# Patient Record
Sex: Female | Born: 1975 | Race: White | Hispanic: No | Marital: Married | Smoking: Former smoker
Health system: Southern US, Community
[De-identification: ages and names within clinical notes are randomized; demographics above are authoritative.]

## PROBLEM LIST (undated history)

## (undated) DIAGNOSIS — G43909 Migraine, unspecified, not intractable, without status migrainosus: Secondary | ICD-10-CM

## (undated) DIAGNOSIS — T7840XA Allergy, unspecified, initial encounter: Secondary | ICD-10-CM

## (undated) DIAGNOSIS — J45909 Unspecified asthma, uncomplicated: Secondary | ICD-10-CM

## (undated) HISTORY — DX: Migraine, unspecified, not intractable, without status migrainosus: G43.909

## (undated) HISTORY — PX: WISDOM TOOTH EXTRACTION: SHX21

## (undated) HISTORY — DX: Allergy, unspecified, initial encounter: T78.40XA

## (undated) HISTORY — DX: Unspecified asthma, uncomplicated: J45.909

---

## 2015-06-06 ENCOUNTER — Encounter: Payer: Self-pay | Admitting: Osteopathic Medicine

## 2015-06-06 ENCOUNTER — Ambulatory Visit (INDEPENDENT_AMBULATORY_CARE_PROVIDER_SITE_OTHER): Payer: PRIVATE HEALTH INSURANCE | Admitting: Osteopathic Medicine

## 2015-06-06 ENCOUNTER — Ambulatory Visit (INDEPENDENT_AMBULATORY_CARE_PROVIDER_SITE_OTHER): Payer: PRIVATE HEALTH INSURANCE

## 2015-06-06 VITALS — BP 129/77 | HR 73 | Ht 65.0 in | Wt 204.0 lb

## 2015-06-06 DIAGNOSIS — Z975 Presence of (intrauterine) contraceptive device: Secondary | ICD-10-CM | POA: Diagnosis not present

## 2015-06-06 DIAGNOSIS — J4530 Mild persistent asthma, uncomplicated: Secondary | ICD-10-CM | POA: Diagnosis not present

## 2015-06-06 DIAGNOSIS — Z8669 Personal history of other diseases of the nervous system and sense organs: Secondary | ICD-10-CM | POA: Insufficient documentation

## 2015-06-06 DIAGNOSIS — Z13220 Encounter for screening for lipoid disorders: Secondary | ICD-10-CM | POA: Insufficient documentation

## 2015-06-06 DIAGNOSIS — N926 Irregular menstruation, unspecified: Secondary | ICD-10-CM | POA: Insufficient documentation

## 2015-06-06 DIAGNOSIS — J302 Other seasonal allergic rhinitis: Secondary | ICD-10-CM | POA: Diagnosis not present

## 2015-06-06 DIAGNOSIS — J45909 Unspecified asthma, uncomplicated: Secondary | ICD-10-CM | POA: Insufficient documentation

## 2015-06-06 LAB — CBC WITH DIFFERENTIAL/PLATELET
BASOS PCT: 1 % (ref 0–1)
Basophils Absolute: 0.1 10*3/uL (ref 0.0–0.1)
EOS ABS: 0.3 10*3/uL (ref 0.0–0.7)
Eosinophils Relative: 4 % (ref 0–5)
HCT: 42.9 % (ref 36.0–46.0)
Hemoglobin: 14.3 g/dL (ref 12.0–15.0)
LYMPHS ABS: 2.7 10*3/uL (ref 0.7–4.0)
Lymphocytes Relative: 34 % (ref 12–46)
MCH: 28.9 pg (ref 26.0–34.0)
MCHC: 33.3 g/dL (ref 30.0–36.0)
MCV: 86.8 fL (ref 78.0–100.0)
MONO ABS: 0.5 10*3/uL (ref 0.1–1.0)
MONOS PCT: 7 % (ref 3–12)
MPV: 10.3 fL (ref 8.6–12.4)
Neutro Abs: 4.2 10*3/uL (ref 1.7–7.7)
Neutrophils Relative %: 54 % (ref 43–77)
PLATELETS: 304 10*3/uL (ref 150–400)
RBC: 4.94 MIL/uL (ref 3.87–5.11)
RDW: 13 % (ref 11.5–15.5)
WBC: 7.8 10*3/uL (ref 4.0–10.5)

## 2015-06-06 LAB — COMPLETE METABOLIC PANEL WITH GFR
ALBUMIN: 4.3 g/dL (ref 3.6–5.1)
ALK PHOS: 85 U/L (ref 33–115)
ALT: 19 U/L (ref 6–29)
AST: 19 U/L (ref 10–30)
BUN: 11 mg/dL (ref 7–25)
CHLORIDE: 103 mmol/L (ref 98–110)
CO2: 27 mmol/L (ref 20–31)
Calcium: 9.7 mg/dL (ref 8.6–10.2)
Creat: 0.8 mg/dL (ref 0.50–1.10)
GFR, Est African American: >89 mL/min (ref >=60)
GFR, Est Non African American: >89 mL/min (ref >=60)
GLUCOSE: 95 mg/dL (ref 65–99)
POTASSIUM: 4.4 mmol/L (ref 3.5–5.3)
SODIUM: 139 mmol/L (ref 135–146)
Total Bilirubin: 0.4 mg/dL (ref 0.2–1.2)
Total Protein: 7 g/dL (ref 6.1–8.1)

## 2015-06-06 LAB — LIPID PANEL
CHOL/HDL RATIO: 5 ratio (ref <=5.0)
Cholesterol: 165 mg/dL (ref 125–200)
HDL: 33 mg/dL — ABNORMAL LOW (ref >=46)
LDL Cholesterol: 95 mg/dL (ref <130)
Triglycerides: 183 mg/dL — ABNORMAL HIGH (ref <150)
VLDL: 37 mg/dL — ABNORMAL HIGH (ref <30)

## 2015-06-06 LAB — TSH: TSH: 1.345 u[IU]/mL (ref 0.350–4.500)

## 2015-06-06 MED ORDER — MONTELUKAST SODIUM 10 MG PO TABS: 10.0000 mg | ORAL_TABLET | Freq: Every day | ORAL | Status: DC

## 2015-06-06 NOTE — Patient Instructions (Signed)
Preventive Care for Adults A healthy lifestyle and preventive care can promote health and wellness. Preventive health guidelines for women include the following key practices.  A routine yearly physical is a good way to check with your health care provider about your health and preventive screening. It is a chance to share any concerns and updates on your health and to receive a thorough exam.  Visit your dentist for a routine exam and preventive care every 6 months. Brush your teeth twice a day and floss once a day. Good oral hygiene prevents tooth decay and gum disease.  The frequency of eye exams is based on your age, health, family medical history, use of contact lenses, and other factors. Follow your health care provider's recommendations for frequency of eye exams.  Eat a healthy diet. Foods like vegetables, fruits, whole grains, low-fat dairy products, and lean protein foods contain the nutrients you need without too many calories. Decrease your intake of foods high in solid fats, added sugars, and salt. Eat the right amount of calories for you.Get information about a proper diet from your health care provider, if necessary.  Regular physical exercise is one of the most important things you can do for your health. Most adults should get at least 150 minutes of moderate-intensity exercise (any activity that increases your heart rate and causes you to sweat) each week. In addition, most adults need muscle-strengthening exercises on 2 or more days a week.  Maintain a healthy weight. The body mass index (BMI) is a screening tool to identify possible weight problems. It provides an estimate of body fat based on height and weight. Your health care provider can find your BMI and can help you achieve or maintain a healthy weight.For adults 20 years and older:  A BMI below 18.5 is considered underweight.  A BMI of 18.5 to 24.9 is normal.  A BMI of 25 to 29.9 is considered overweight.  A BMI of  30 and above is considered obese.  Maintain normal blood lipids and cholesterol levels by exercising and minimizing your intake of saturated fat. Eat a balanced diet with plenty of fruit and vegetables. Blood tests for lipids and cholesterol should begin at age 76 and be repeated every 5 years. If your lipid or cholesterol levels are high, you are over 50, or you are at high risk for heart disease, you may need your cholesterol levels checked more frequently.Ongoing high lipid and cholesterol levels should be treated with medicines if diet and exercise are not working.  If you smoke, find out from your health care provider how to quit. If you do not use tobacco, do not start.  Lung cancer screening is recommended for adults aged 22-80 years who are at high risk for developing lung cancer because of a history of smoking. A yearly low-dose CT scan of the lungs is recommended for people who have at least a 30-pack-year history of smoking and are a current smoker or have quit within the past 15 years. A pack year of smoking is smoking an average of 1 pack of cigarettes a day for 1 year (for example: 1 pack a day for 30 years or 2 packs a day for 15 years). Yearly screening should continue until the smoker has stopped smoking for at least 15 years. Yearly screening should be stopped for people who develop a health problem that would prevent them from having lung cancer treatment.  If you are pregnant, do not drink alcohol. If you are breastfeeding,  be very cautious about drinking alcohol. If you are not pregnant and choose to drink alcohol, do not have more than 1 drink per day. One drink is considered to be 12 ounces (355 mL) of beer, 5 ounces (148 mL) of wine, or 1.5 ounces (44 mL) of liquor.  Avoid use of street drugs. Do not share needles with anyone. Ask for help if you need support or instructions about stopping the use of drugs.  High blood pressure causes heart disease and increases the risk of  stroke. Your blood pressure should be checked at least every 1 to 2 years. Ongoing high blood pressure should be treated with medicines if weight loss and exercise do not work.  If you are 3-86 years old, ask your health care provider if you should take aspirin to prevent strokes.  Diabetes screening involves taking a blood sample to check your fasting blood sugar level. This should be done once every 3 years, after age 67, if you are within normal weight and without risk factors for diabetes. Testing should be considered at a younger age or be carried out more frequently if you are overweight and have at least 1 risk factor for diabetes.  Breast cancer screening is essential preventive care for women. You should practice "breast self-awareness." This means understanding the normal appearance and feel of your breasts and may include breast self-examination. Any changes detected, no matter how small, should be reported to a health care provider. Women in their 8s and 30s should have a clinical breast exam (CBE) by a health care provider as part of a regular health exam every 1 to 3 years. After age 70, women should have a CBE every year. Starting at age 25, women should consider having a mammogram (breast X-ray test) every year. Women who have a family history of breast cancer should talk to their health care provider about genetic screening. Women at a high risk of breast cancer should talk to their health care providers about having an MRI and a mammogram every year.  Breast cancer gene (BRCA)-related cancer risk assessment is recommended for women who have family members with BRCA-related cancers. BRCA-related cancers include breast, ovarian, tubal, and peritoneal cancers. Having family members with these cancers may be associated with an increased risk for harmful changes (mutations) in the breast cancer genes BRCA1 and BRCA2. Results of the assessment will determine the need for genetic counseling and  BRCA1 and BRCA2 testing.  Routine pelvic exams to screen for cancer are no longer recommended for nonpregnant women who are considered low risk for cancer of the pelvic organs (ovaries, uterus, and vagina) and who do not have symptoms. Ask your health care provider if a screening pelvic exam is right for you.  If you have had past treatment for cervical cancer or a condition that could lead to cancer, you need Pap tests and screening for cancer for at least 20 years after your treatment. If Pap tests have been discontinued, your risk factors (such as having a new sexual partner) need to be reassessed to determine if screening should be resumed. Some women have medical problems that increase the chance of getting cervical cancer. In these cases, your health care provider may recommend more frequent screening and Pap tests.  The HPV test is an additional test that may be used for cervical cancer screening. The HPV test looks for the virus that can cause the cell changes on the cervix. The cells collected during the Pap test can be  tested for HPV. The HPV test could be used to screen women aged 30 years and older, and should be used in women of any age who have unclear Pap test results. After the age of 30, women should have HPV testing at the same frequency as a Pap test.  Colorectal cancer can be detected and often prevented. Most routine colorectal cancer screening begins at the age of 50 years and continues through age 75 years. However, your health care provider may recommend screening at an earlier age if you have risk factors for colon cancer. On a yearly basis, your health care provider may provide home test kits to check for hidden blood in the stool. Use of a small camera at the end of a tube, to directly examine the colon (sigmoidoscopy or colonoscopy), can detect the earliest forms of colorectal cancer. Talk to your health care provider about this at age 50, when routine screening begins. Direct  exam of the colon should be repeated every 5-10 years through age 75 years, unless early forms of pre-cancerous polyps or small growths are found.  People who are at an increased risk for hepatitis B should be screened for this virus. You are considered at high risk for hepatitis B if:  You were born in a country where hepatitis B occurs often. Talk with your health care provider about which countries are considered high risk.  Your parents were born in a high-risk country and you have not received a shot to protect against hepatitis B (hepatitis B vaccine).  You have HIV or AIDS.  You use needles to inject street drugs.  You live with, or have sex with, someone who has hepatitis B.  You get hemodialysis treatment.  You take certain medicines for conditions like cancer, organ transplantation, and autoimmune conditions.  Hepatitis C blood testing is recommended for all people born from 1945 through 1965 and any individual with known risks for hepatitis C.  Practice safe sex. Use condoms and avoid high-risk sexual practices to reduce the spread of sexually transmitted infections (STIs). STIs include gonorrhea, chlamydia, syphilis, trichomonas, herpes, HPV, and human immunodeficiency virus (HIV). Herpes, HIV, and HPV are viral illnesses that have no cure. They can result in disability, cancer, and death.  You should be screened for sexually transmitted illnesses (STIs) including gonorrhea and chlamydia if:  You are sexually active and are younger than 24 years.  You are older than 24 years and your health care provider tells you that you are at risk for this type of infection.  Your sexual activity has changed since you were last screened and you are at an increased risk for chlamydia or gonorrhea. Ask your health care provider if you are at risk.  If you are at risk of being infected with HIV, it is recommended that you take a prescription medicine daily to prevent HIV infection. This is  called preexposure prophylaxis (PrEP). You are considered at risk if:  You are a heterosexual woman, are sexually active, and are at increased risk for HIV infection.  You take drugs by injection.  You are sexually active with a partner who has HIV.  Talk with your health care provider about whether you are at high risk of being infected with HIV. If you choose to begin PrEP, you should first be tested for HIV. You should then be tested every 3 months for as long as you are taking PrEP.  Osteoporosis is a disease in which the bones lose minerals and strength   with aging. This can result in serious bone fractures or breaks. The risk of osteoporosis can be identified using a bone density scan. Women ages 65 years and over and women at risk for fractures or osteoporosis should discuss screening with their health care providers. Ask your health care provider whether you should take a calcium supplement or vitamin D to reduce the rate of osteoporosis.  Menopause can be associated with physical symptoms and risks. Hormone replacement therapy is available to decrease symptoms and risks. You should talk to your health care provider about whether hormone replacement therapy is right for you.  Use sunscreen. Apply sunscreen liberally and repeatedly throughout the day. You should seek shade when your shadow is shorter than you. Protect yourself by wearing long sleeves, pants, a wide-brimmed hat, and sunglasses year round, whenever you are outdoors.  Once a month, do a whole body skin exam, using a mirror to look at the skin on your back. Tell your health care provider of new moles, moles that have irregular borders, moles that are larger than a pencil eraser, or moles that have changed in shape or color.  Stay current with required vaccines (immunizations).  Influenza vaccine. All adults should be immunized every year.  Tetanus, diphtheria, and acellular pertussis (Td, Tdap) vaccine. Pregnant women should  receive 1 dose of Tdap vaccine during each pregnancy. The dose should be obtained regardless of the length of time since the last dose. Immunization is preferred during the 27th-36th week of gestation. An adult who has not previously received Tdap or who does not know her vaccine status should receive 1 dose of Tdap. This initial dose should be followed by tetanus and diphtheria toxoids (Td) booster doses every 10 years. Adults with an unknown or incomplete history of completing a 3-dose immunization series with Td-containing vaccines should begin or complete a primary immunization series including a Tdap dose. Adults should receive a Td booster every 10 years.  Varicella vaccine. An adult without evidence of immunity to varicella should receive 2 doses or a second dose if she has previously received 1 dose. Pregnant females who do not have evidence of immunity should receive the first dose after pregnancy. This first dose should be obtained before leaving the health care facility. The second dose should be obtained 4-8 weeks after the first dose.  Human papillomavirus (HPV) vaccine. Females aged 13-26 years who have not received the vaccine previously should obtain the 3-dose series. The vaccine is not recommended for use in pregnant females. However, pregnancy testing is not needed before receiving a dose. If a female is found to be pregnant after receiving a dose, no treatment is needed. In that case, the remaining doses should be delayed until after the pregnancy. Immunization is recommended for any person with an immunocompromised condition through the age of 26 years if she did not get any or all doses earlier. During the 3-dose series, the second dose should be obtained 4-8 weeks after the first dose. The third dose should be obtained 24 weeks after the first dose and 16 weeks after the second dose.  Zoster vaccine. One dose is recommended for adults aged 60 years or older unless certain conditions are  present.  Measles, mumps, and rubella (MMR) vaccine. Adults born before 1957 generally are considered immune to measles and mumps. Adults born in 1957 or later should have 1 or more doses of MMR vaccine unless there is a contraindication to the vaccine or there is laboratory evidence of immunity to   each of the three diseases. A routine second dose of MMR vaccine should be obtained at least 28 days after the first dose for students attending postsecondary schools, health care workers, or international travelers. People who received inactivated measles vaccine or an unknown type of measles vaccine during 1963-1967 should receive 2 doses of MMR vaccine. People who received inactivated mumps vaccine or an unknown type of mumps vaccine before 1979 and are at high risk for mumps infection should consider immunization with 2 doses of MMR vaccine. For females of childbearing age, rubella immunity should be determined. If there is no evidence of immunity, females who are not pregnant should be vaccinated. If there is no evidence of immunity, females who are pregnant should delay immunization until after pregnancy. Unvaccinated health care workers born before 1957 who lack laboratory evidence of measles, mumps, or rubella immunity or laboratory confirmation of disease should consider measles and mumps immunization with 2 doses of MMR vaccine or rubella immunization with 1 dose of MMR vaccine.  Pneumococcal 13-valent conjugate (PCV13) vaccine. When indicated, a person who is uncertain of her immunization history and has no record of immunization should receive the PCV13 vaccine. An adult aged 19 years or older who has certain medical conditions and has not been previously immunized should receive 1 dose of PCV13 vaccine. This PCV13 should be followed with a dose of pneumococcal polysaccharide (PPSV23) vaccine. The PPSV23 vaccine dose should be obtained at least 8 weeks after the dose of PCV13 vaccine. An adult aged 19  years or older who has certain medical conditions and previously received 1 or more doses of PPSV23 vaccine should receive 1 dose of PCV13. The PCV13 vaccine dose should be obtained 1 or more years after the last PPSV23 vaccine dose.  Pneumococcal polysaccharide (PPSV23) vaccine. When PCV13 is also indicated, PCV13 should be obtained first. All adults aged 65 years and older should be immunized. An adult younger than age 65 years who has certain medical conditions should be immunized. Any person who resides in a nursing home or long-term care facility should be immunized. An adult smoker should be immunized. People with an immunocompromised condition and certain other conditions should receive both PCV13 and PPSV23 vaccines. People with human immunodeficiency virus (HIV) infection should be immunized as soon as possible after diagnosis. Immunization during chemotherapy or radiation therapy should be avoided. Routine use of PPSV23 vaccine is not recommended for American Indians, [place] Natives, or people younger than 65 years unless there are medical conditions that require PPSV23 vaccine. When indicated, people who have unknown immunization and have no record of immunization should receive PPSV23 vaccine. One-time revaccination 5 years after the first dose of PPSV23 is recommended for people aged 19-64 years who have chronic kidney failure, nephrotic syndrome, asplenia, or immunocompromised conditions. People who received 1-2 doses of PPSV23 before age 65 years should receive another dose of PPSV23 vaccine at age 65 years or later if at least 5 years have passed since the previous dose. Doses of PPSV23 are not needed for people immunized with PPSV23 at or after age 65 years.  Meningococcal vaccine. Adults with asplenia or persistent complement component deficiencies should receive 2 doses of quadrivalent meningococcal conjugate (MenACWY-D) vaccine. The doses should be obtained at least 2 months apart.  Microbiologists working with certain meningococcal bacteria, military recruits, people at risk during an outbreak, and people who travel to or live in countries with a high rate of meningitis should be immunized. A first-year college student up through age   21 years who is living in a residence hall should receive a dose if she did not receive a dose on or after her 16th birthday. Adults who have certain high-risk conditions should receive one or more doses of vaccine.  Hepatitis A vaccine. Adults who wish to be protected from this disease, have certain high-risk conditions, work with hepatitis A-infected animals, work in hepatitis A research labs, or travel to or work in countries with a high rate of hepatitis A should be immunized. Adults who were previously unvaccinated and who anticipate close contact with an international adoptee during the first 60 days after arrival in the [place] from a country with a high rate of hepatitis A should be immunized.  Hepatitis B vaccine. Adults who wish to be protected from this disease, have certain high-risk conditions, may be exposed to blood or other infectious body fluids, are household contacts or sex partners of hepatitis B positive people, are clients or workers in certain care facilities, or travel to or work in countries with a high rate of hepatitis B should be immunized.  Haemophilus influenzae type b (Hib) vaccine. A previously unvaccinated person with asplenia or sickle cell disease or having a scheduled splenectomy should receive 1 dose of Hib vaccine. Regardless of previous immunization, a recipient of a hematopoietic stem cell transplant should receive a 3-dose series 6-12 months after her successful transplant. Hib vaccine is not recommended for adults with HIV infection. Preventive Services / Frequency Ages 64 to 68 years  Blood pressure check.** / Every 1 to 2 years.  Lipid and cholesterol check.** / Every 5 years beginning at age  22.  Clinical breast exam.** / Every 3 years for women in their 88s and 53s.  BRCA-related cancer risk assessment.** / For women who have family members with a BRCA-related cancer (breast, ovarian, tubal, or peritoneal cancers).  Pap test.** / Every 2 years from ages 90 through 51. Every 3 years starting at age 21 through age 56 or 3 with a history of 3 consecutive normal Pap tests.  HPV screening.** / Every 3 years from ages 24 through ages 1 to 46 with a history of 3 consecutive normal Pap tests.  Hepatitis C blood test.** / For any individual with known risks for hepatitis C.  Skin self-exam. / Monthly.  Influenza vaccine. / Every year.  Tetanus, diphtheria, and acellular pertussis (Tdap, Td) vaccine.** / Consult your health care provider. Pregnant women should receive 1 dose of Tdap vaccine during each pregnancy. 1 dose of Td every 10 years.  Varicella vaccine.** / Consult your health care provider. Pregnant females who do not have evidence of immunity should receive the first dose after pregnancy.  HPV vaccine. / 3 doses over 6 months, if 72 and younger. The vaccine is not recommended for use in pregnant females. However, pregnancy testing is not needed before receiving a dose.  Measles, mumps, rubella (MMR) vaccine.** / You need at least 1 dose of MMR if you were born in 1957 or later. You may also need a 2nd dose. For females of childbearing age, rubella immunity should be determined. If there is no evidence of immunity, females who are not pregnant should be vaccinated. If there is no evidence of immunity, females who are pregnant should delay immunization until after pregnancy.  Pneumococcal 13-valent conjugate (PCV13) vaccine.** / Consult your health care provider.  Pneumococcal polysaccharide (PPSV23) vaccine.** / 1 to 2 doses if you smoke cigarettes or if you have certain conditions.  Meningococcal vaccine.** /  1 dose if you are age 19 to 21 years and a first-year college  student living in a residence hall, or have one of several medical conditions, you need to get vaccinated against meningococcal disease. You may also need additional booster doses.  Hepatitis A vaccine.** / Consult your health care provider.  Hepatitis B vaccine.** / Consult your health care provider.  Haemophilus influenzae type b (Hib) vaccine.** / Consult your health care provider. Ages 40 to 64 years  Blood pressure check.** / Every 1 to 2 years.  Lipid and cholesterol check.** / Every 5 years beginning at age 20 years.  Lung cancer screening. / Every year if you are aged 55-80 years and have a 30-pack-year history of smoking and currently smoke or have quit within the past 15 years. Yearly screening is stopped once you have quit smoking for at least 15 years or develop a health problem that would prevent you from having lung cancer treatment.  Clinical breast exam.** / Every year after age 40 years.  BRCA-related cancer risk assessment.** / For women who have family members with a BRCA-related cancer (breast, ovarian, tubal, or peritoneal cancers).  Mammogram.** / Every year beginning at age 40 years and continuing for as long as you are in good health. Consult with your health care provider.  Pap test.** / Every 3 years starting at age 30 years through age 65 or 70 years with a history of 3 consecutive normal Pap tests.  HPV screening.** / Every 3 years from ages 30 years through ages 65 to 70 years with a history of 3 consecutive normal Pap tests.  Fecal occult blood test (FOBT) of stool. / Every year beginning at age 50 years and continuing until age 75 years. You may not need to do this test if you get a colonoscopy every 10 years.  Flexible sigmoidoscopy or colonoscopy.** / Every 5 years for a flexible sigmoidoscopy or every 10 years for a colonoscopy beginning at age 50 years and continuing until age 75 years.  Hepatitis C blood test.** / For all people born from 1945 through  1965 and any individual with known risks for hepatitis C.  Skin self-exam. / Monthly.  Influenza vaccine. / Every year.  Tetanus, diphtheria, and acellular pertussis (Tdap/Td) vaccine.** / Consult your health care provider. Pregnant women should receive 1 dose of Tdap vaccine during each pregnancy. 1 dose of Td every 10 years.  Varicella vaccine.** / Consult your health care provider. Pregnant females who do not have evidence of immunity should receive the first dose after pregnancy.  Zoster vaccine.** / 1 dose for adults aged 60 years or older.  Measles, mumps, rubella (MMR) vaccine.** / You need at least 1 dose of MMR if you were born in 1957 or later. You may also need a 2nd dose. For females of childbearing age, rubella immunity should be determined. If there is no evidence of immunity, females who are not pregnant should be vaccinated. If there is no evidence of immunity, females who are pregnant should delay immunization until after pregnancy.  Pneumococcal 13-valent conjugate (PCV13) vaccine.** / Consult your health care provider.  Pneumococcal polysaccharide (PPSV23) vaccine.** / 1 to 2 doses if you smoke cigarettes or if you have certain conditions.  Meningococcal vaccine.** / Consult your health care provider.  Hepatitis A vaccine.** / Consult your health care provider.  Hepatitis B vaccine.** / Consult your health care provider.  Haemophilus influenzae type b (Hib) vaccine.** / Consult your health care provider. Ages 65   years and over  Blood pressure check.** / Every 1 to 2 years.  Lipid and cholesterol check.** / Every 5 years beginning at age 22 years.  Lung cancer screening. / Every year if you are aged 73-80 years and have a 30-pack-year history of smoking and currently smoke or have quit within the past 15 years. Yearly screening is stopped once you have quit smoking for at least 15 years or develop a health problem that would prevent you from having lung cancer  treatment.  Clinical breast exam.** / Every year after age 4 years.  BRCA-related cancer risk assessment.** / For women who have family members with a BRCA-related cancer (breast, ovarian, tubal, or peritoneal cancers).  Mammogram.** / Every year beginning at age 40 years and continuing for as long as you are in good health. Consult with your health care provider.  Pap test.** / Every 3 years starting at age 9 years through age 34 or 91 years with 3 consecutive normal Pap tests. Testing can be stopped between 65 and 70 years with 3 consecutive normal Pap tests and no abnormal Pap or HPV tests in the past 10 years.  HPV screening.** / Every 3 years from ages 57 years through ages 64 or 45 years with a history of 3 consecutive normal Pap tests. Testing can be stopped between 65 and 70 years with 3 consecutive normal Pap tests and no abnormal Pap or HPV tests in the past 10 years.  Fecal occult blood test (FOBT) of stool. / Every year beginning at age 15 years and continuing until age 17 years. You may not need to do this test if you get a colonoscopy every 10 years.  Flexible sigmoidoscopy or colonoscopy.** / Every 5 years for a flexible sigmoidoscopy or every 10 years for a colonoscopy beginning at age 86 years and continuing until age 71 years.  Hepatitis C blood test.** / For all people born from [birth year] through 1964 and any individual with known risks for hepatitis C.  Osteoporosis screening.** / A one-time screening for women ages 83 years and over and women at risk for fractures or osteoporosis.  Skin self-exam. / Monthly.  Influenza vaccine. / Every year.  Tetanus, diphtheria, and acellular pertussis (Tdap/Td) vaccine.** / 1 dose of Td every 10 years.  Varicella vaccine.** / Consult your health care provider.  Zoster vaccine.** / 1 dose for adults aged 61 years or older.  Pneumococcal 13-valent conjugate (PCV13) vaccine.** / Consult your health care provider.  Pneumococcal  polysaccharide (PPSV23) vaccine.** / 1 dose for all adults aged 28 years and older.  Meningococcal vaccine.** / Consult your health care provider.  Hepatitis A vaccine.** / Consult your health care provider.  Hepatitis B vaccine.** / Consult your health care provider.  Haemophilus influenzae type b (Hib) vaccine.** / Consult your health care provider. ** Family history and personal history of risk and conditions may change your health care provider's recommendations. Document Released: 12/01/2001 Document Revised: 02/19/2014 Document Reviewed: 03/02/2011 [facility] Patient Information 2015 [place], [place]. This information is not intended to replace advice given to you by your health care provider. Make sure you discuss any questions you have with your health care provider.

## 2015-06-06 NOTE — Progress Notes (Signed)
HPI: Candice Hanson is a 39 y.o. female who presents to [facility] today for establish care, previously seen by [facility], and [facility] - records pending.   Complaints of. CHRONIC PROBLEMS Asthma and allergies STABLE - reports good control on Symbicort, hardly ever needs rescue inhaler, last filled Albuterol 2014.  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 Gets occasional headaches, takes Advil as needed which helps these  NEW CONCERN Irregular menstrual cycle - can go several months without periods and does have some spotting in between, reports some nights. Last pap/pelvic done years ago. Had Mirena placed years ago about 7 - 8 years ago, and went tohave it removed 4 years ago and unable to find it. Never has [place] or other imaging to look for it.    Adopted - not sure of family history.      Past medical, social and family history reviewed: Past Medical History  Diagnosis Date  . Allergy   . Asthma   . Migraine headache    History reviewed. No pertinent past surgical history. Social History  Substance Use Topics  . Smoking status: Former Smoker    Quit date: 10/20/1999  . Smokeless tobacco: Not on file  . Alcohol Use: 0.0 oz/week    0 Standard drinks or equivalent per week     Comment: 1/week   Medications: Current Outpatient Prescriptions  Medication Sig Dispense Refill  . albuterol (PROVENTIL HFA;VENTOLIN HFA) 108 (90 BASE) MCG/ACT inhaler Inhale 1-2 puffs into the lungs as needed for wheezing or shortness of breath.    . budesonide-formoterol (SYMBICORT) 80-4.5 MCG/ACT inhaler Inhale 1 puff into the lungs 2 (two) times daily.    . Fluticasone Propionate (FLONASE NA) Place into the nose. Once per nostril. Once daily - Every other day    .  montelukast (SINGULAIR) 10 MG tablet Take 1 tablet (10 mg total) by mouth at bedtime. 90 tablet 3  . Multiple Vitamins-Minerals (MULTIVITAMIN ADULT PO) Take by mouth.     No current facility-administered medications for this visit.   Allergies no known allergies Family History  Problem Relation Age of Onset  . Adopted: Yes  . Family history unknown: Yes     Review of Systems: CONSTITUTIONAL: Neg fever/chills, no unintentional weight changes HEAD/EYES/EARS/NOSE: No headache/vision change or hearing change CARDIAC: No chest pain/pressure/palpitations, no orthopnea RESPIRATORY: No cough/shortness of breath/wheeze GASTROINTESTINAL: No nausea/vomiting/abdominal pain/blood in stool/diarrhea/constipation MUSCULOSKELETAL: No myalgia/arthralgia GENITOURINARY: No incontinence, No abnormal genital bleeding/discharge SKIN: No rash/wounds/concerning lesions HEM/ONC: No easy bruising/bleeding, no abnormal lymph node PSYCHIATRIC: No concerns with depression/anxiety or sleep problems.    Exam:  BP 129/77 mmHg  Pulse 73  Ht 5' 5"  (1.651 m)  Wt 204 lb (92.534 kg)  BMI 33.95 kg/m2  SpO2 99% Constitutional: VSS, see above. General Appearance: alert, well-developed, well-nourished, NAD Eyes: Normal lids and conjunctive, non-icteric sclera, PERRLA Ears, Nose, Mouth, Throat: Normal external inspection ears/nares/mouth/lips/gums, Normal TM bilaterally, MMM, posterior pharynx without erythema/exudate Neck: No masses, trachea midline. No thyroid enlargement/tenderness/mass appreciated Respiratory: Normal respiratory effort. No dullness/hyper-resonance to percussion. Breath sounds normal, no wheeze/rhonchi/rales Cardiovascular: S1/S2 normal, no murmur/rub/gallop auscultated. No carotid bruit or JVD. No abdominal aortic bruit. Pedal pulse II/IV bilaterally DP and PT. No lower extremity edema. Gastrointestinal: Nontender, no masses. No hepatomegaly, no splenomegaly. No hernia appreciated. Rectal exam  deferred.  Musculoskeletal: Gait normal. No clubbing/cyanosis of digits.  Neurological: No cranial nerve deficit on limited exam. Motor and sensation intact and symmetric Psychiatric: Normal judgment/insight. Normal mood and affect. Oriented x3.    No results found for this or any previous visit (from the past 72 hour(s)).    ASSESSMENT/PLAN:  Intrauterine contraceptive device status - According to pt, strings were not visible and no attempts made to locate/retrieve the IUD with pervious physician I performed point-of-care limited abdominal US in office - IUD appears to be in place in uterine cavity however given the nonspecialized equipment in clinic, and considering I do not have formal ultrasound interpretation training, will further evaluate for possible migration with KUB and (KUB pend - Plan: DG Abd 2 Views).  Patient to come back for her pelvic exam and Pap smear at annual wellness visit - will attempt IUD removal at that visit if x-rays are consistent with our ultrasound findings in the office, and if still unable to remove IUD may get formal ultrasound and send to GYN.  If KUB equivocal or indicative of migration, will need additional workup and possible referral based on results.  Pt verbalizes understanding and is in agreement with this plan.   Irregular uterine bleeding - Mirena - need location confirmed - Plan: COMPLETE METABOLIC PANEL WITH GFR, TSH, POCT urine pregnancy, CBC with Differential, we'll hold off more extensive workup until placement of Mirena can be confirmed. Pt counseled to RTC or be seen in ER if significant bleeding/fatigue/dizziness/pain or other concerns.   History of migraine - No exacerbations since 2014, patient to return to clinic if any problem with this  Asthma, chronic, mild persistent, uncomplicated - Pt declines alternate/deescalation of therapy - Plan: montelukast (SINGULAIR) 10 MG tablet, Cont Symbicort and continue Albuterol prn. Given relative good  control,  patient given option to de-escalate therapy from Symbicort, however she is happy with current medication regimen and would like to continue at this time.   Lipid screening - Plan: Lipid panel  Seasonal allergies - controlled on Singulair and Flonase   Records from previous PCP pending  RTC for annual physical.

## 2015-06-20 ENCOUNTER — Encounter: Payer: Self-pay | Admitting: Osteopathic Medicine

## 2015-06-20 ENCOUNTER — Ambulatory Visit (INDEPENDENT_AMBULATORY_CARE_PROVIDER_SITE_OTHER): Payer: PRIVATE HEALTH INSURANCE | Admitting: Osteopathic Medicine

## 2015-06-20 ENCOUNTER — Other Ambulatory Visit (HOSPITAL_COMMUNITY)
Admission: RE | Admit: 2015-06-20 | Discharge: 2015-06-20 | Disposition: A | Discharge status: Home / Self Care | Payer: PRIVATE HEALTH INSURANCE | Source: Ambulatory Visit | Attending: Osteopathic Medicine | Admitting: Osteopathic Medicine

## 2015-06-20 VITALS — BP 125/79 | HR 84 | Wt 204.0 lb

## 2015-06-20 DIAGNOSIS — Z124 Encounter for screening for malignant neoplasm of cervix: Secondary | ICD-10-CM | POA: Diagnosis not present

## 2015-06-20 DIAGNOSIS — T8389XA Other specified complication of genitourinary prosthetic devices, implants and grafts, initial encounter: Secondary | ICD-10-CM

## 2015-06-20 DIAGNOSIS — Z1151 Encounter for screening for human papillomavirus (HPV): Secondary | ICD-10-CM | POA: Insufficient documentation

## 2015-06-20 DIAGNOSIS — Z113 Encounter for screening for infections with a predominantly sexual mode of transmission: Secondary | ICD-10-CM | POA: Insufficient documentation

## 2015-06-20 DIAGNOSIS — N760 Acute vaginitis: Secondary | ICD-10-CM | POA: Diagnosis present

## 2015-06-20 DIAGNOSIS — Z01419 Encounter for gynecological examination (general) (routine) without abnormal findings: Secondary | ICD-10-CM | POA: Insufficient documentation

## 2015-06-20 NOTE — Progress Notes (Signed)
HPI: Candice Hanson is a 39 y.o. female who presents to [place]  today for chief complaint of: attempt IUD removal  Patient initially On August 18, was here to establish care and address irregular menstrual cycle. She had Mirena placed about 7 years ago and 4 years ago went to have it removed. Per the patient, her provider was unable to find the string and told her apparently not to worry about it. Patient presents today to try to have this removed. At last visit we did bedside ultrasound to confirm intrauterine IUD and she also got abdominal x-ray to confirm that it had not migrated into the abdomen, these results appear that the device is still intrauterine.    Past medical, social and family history reviewed: Past Medical History  Diagnosis Date  . Allergy   . Asthma   . Migraine headache    No past surgical history on file. Social History  Substance Use Topics  . Smoking status: Former Smoker    Quit date: 10/20/1999  . Smokeless tobacco: Not on file  . Alcohol Use: 0.0 oz/week    0 Standard drinks or equivalent per week     Comment: 1/week   Family History  Problem Relation Age of Onset  . Adopted: Yes  . Family history unknown: Yes    Current Outpatient Prescriptions  Medication Sig Dispense Refill  . albuterol (PROVENTIL HFA;VENTOLIN HFA) 108 (90 BASE) MCG/ACT inhaler Inhale 1-2 puffs into the lungs as needed for wheezing or shortness of breath.    . budesonide-formoterol (SYMBICORT) 80-4.5 MCG/ACT inhaler Inhale 1 puff into the lungs 2 (two) times daily.    . Fluticasone Propionate (FLONASE NA) Place into the nose. Once per nostril. Once daily - Every other day    . montelukast (SINGULAIR) 10 MG tablet Take 1 tablet (10 mg total) by mouth at bedtime. 90 tablet 3  . Multiple Vitamins-Minerals (MULTIVITAMIN ADULT PO) Take by mouth.     No current facility-administered medications for this visit.   No Known Allergies   Review of  Systems: CONSTITUTIONAL: Neg fever/chills, no unintentional weight changes CARDIAC: No chest pain/pressure/palpitations, no orthopnea RESPIRATORY: No cough/shortness of breath/wheeze GASTROINTESTINAL: No nausea/vomiting/abdominal pain/blood in stool/diarrhea/constipation MUSCULOSKELETAL: No myalgia/arthralgia GENITOURINARY: No incontinence, No abnormal genital bleeding/discharge    Exam:  BP 125/79 mmHg  Pulse 84  Wt 204 lb (92.534 kg)  SpO2 96% Constitutional: VSS, see above. General Appearance: alert, well-developed, well-nourished, NAD GYN: No lesions/ulcers to external genitalia, normal urethra, normal vaginal mucosa, physiologic discharge, cervix normal without lesions, uterus not enlarged or tender, adnexa no masses and nontender. Pap smear obtained. Unable to visualize strings of IUD. Cervix is clean and attempted to use endocervical brush to see IUD strings were able to be visualized that way but was unsuccessful.   No results found for this or any previous visit (from the past 72 hour(s)).    ASSESSMENT/PLAN:  IUD complication, initial encounter - Plan: Ambulatory referral to Gynecology  Screening for cervical cancer - Plan: Cytology - PAP    Pap test obtained. Was unable to visualize IUD string so will refer to GYN to further address this issue. All patient's questions were answered, she is amenable to this plan

## 2015-06-21 LAB — CYTOLOGY - PAP

## 2015-06-23 LAB — CERVICOVAGINAL ANCILLARY ONLY
Bacterial vaginitis: NEGATIVE
Candida vaginitis: NEGATIVE

## 2015-07-18 ENCOUNTER — Encounter: Payer: PRIVATE HEALTH INSURANCE | Admitting: Obstetrics & Gynecology

## 2015-08-21 ENCOUNTER — Encounter: Payer: Self-pay | Admitting: Obstetrics & Gynecology

## 2015-08-21 ENCOUNTER — Ambulatory Visit (INDEPENDENT_AMBULATORY_CARE_PROVIDER_SITE_OTHER): Payer: PRIVATE HEALTH INSURANCE | Admitting: Obstetrics & Gynecology

## 2015-08-21 VITALS — BP 112/71 | HR 81 | Resp 16 | Ht 65.0 in | Wt 195.0 lb

## 2015-08-21 DIAGNOSIS — Z30433 Encounter for removal and reinsertion of intrauterine contraceptive device: Secondary | ICD-10-CM | POA: Diagnosis not present

## 2015-08-21 DIAGNOSIS — Z01812 Encounter for preprocedural laboratory examination: Secondary | ICD-10-CM

## 2015-08-21 LAB — POCT URINE PREGNANCY: PREG TEST UR: NEGATIVE

## 2015-08-21 MED ORDER — LEVONORGESTREL 20 MCG/24HR IU IUD
INTRAUTERINE_SYSTEM | Freq: Once | INTRAUTERINE | Status: AC
  Administered 2015-08-21: 11:00:00 via INTRAUTERINE

## 2015-08-21 NOTE — Progress Notes (Signed)
   Subjective:    Patient ID: Candice Hanson, female    DOB: 08/09/1976, 39 y.o.   MRN: [id number]  HPI  39 yo MW P3 (all daughters) is here to have her expired (38 1/38 yo) Mirena replaced with a new one.   Review of Systems Pap normal and UTD.    Objective:   Physical Exam [id number] Breathing, conversing, and ambulating normally I easily removed her Mirena after prepping her cervix with betadine. UPT negative, consent signed, Time out procedure done.  Mirena was easily placed and the strings were cut to 3-4 cm. Uterus sounded to 9 cm. She tolerated the procedure well.         Assessment & Plan:

## 2015-09-23 ENCOUNTER — Encounter: Payer: Self-pay | Admitting: Osteopathic Medicine

## 2015-09-23 ENCOUNTER — Ambulatory Visit (INDEPENDENT_AMBULATORY_CARE_PROVIDER_SITE_OTHER): Payer: PRIVATE HEALTH INSURANCE | Admitting: Osteopathic Medicine

## 2015-09-23 VITALS — BP 114/68 | HR 64 | Temp 98.8°F | Wt 199.0 lb

## 2015-09-23 DIAGNOSIS — A499 Bacterial infection, unspecified: Secondary | ICD-10-CM | POA: Diagnosis not present

## 2015-09-23 DIAGNOSIS — J329 Chronic sinusitis, unspecified: Secondary | ICD-10-CM | POA: Diagnosis not present

## 2015-09-23 DIAGNOSIS — B9689 Other specified bacterial agents as the cause of diseases classified elsewhere: Secondary | ICD-10-CM

## 2015-09-23 MED ORDER — AMOXICILLIN-POT CLAVULANATE 875-125 MG PO TABS: 1.0000 | ORAL_TABLET | Freq: Two times a day (BID) | ORAL | Status: DC

## 2015-09-23 NOTE — Progress Notes (Signed)
HPI: Candice Hanson is a 39 y.o. female who presents to [facility]  today for chief complaint of:  Chief Complaint  Patient presents with  . Sinus Problem    . Location: sinuses . Quality: sore, stuffy . Severity: moderate . Duration: started 3 weeks ago with drainage and sore throat but hasnt gotten much better other than sore throat  . Modifying factors: Mucinex, Advil cold & sinus . Assoc signs/symptoms: no fever/chills, (+ )fatigue   Past medical, social and family history reviewed: Past Medical History  Diagnosis Date  . Allergy   . Asthma   . Migraine headache    Past Surgical History  Procedure Laterality Date  . Wisdom tooth extraction     Social History  Substance Use Topics  . Smoking status: Former Smoker    Quit date: 10/20/1999  . Smokeless tobacco: Not on file  . Alcohol Use: 0.0 oz/week    0 Standard drinks or equivalent per week     Comment: 1/week   Family History  Problem Relation Age of Onset  . Adopted: Yes  . Family history unknown: Yes    Current Outpatient Prescriptions  Medication Sig Dispense Refill  . albuterol (PROVENTIL HFA;VENTOLIN HFA) 108 (90 BASE) MCG/ACT inhaler Inhale 1-2 puffs into the lungs as needed for wheezing or shortness of breath.    . budesonide-formoterol (SYMBICORT) 80-4.5 MCG/ACT inhaler Inhale 1 puff into the lungs 2 (two) times daily.    . fexofenadine (ALLEGRA) 180 MG tablet Take 180 mg by mouth.    . Fluticasone Propionate (FLONASE NA) Place into the nose. Once per nostril. Once daily - Every other day    . levonorgestrel (MIRENA) 20 MCG/24HR IUD 1 each by Intrauterine route once.    . montelukast (SINGULAIR) 10 MG tablet Take 1 tablet (10 mg total) by mouth at bedtime. 90 tablet 3  . Multiple Vitamins-Minerals (MULTIVITAMIN ADULT PO) Take by mouth.     No current facility-administered medications for this visit.   No Known Allergies    Review of Systems: CONSTITUTIONAL:  No   fever, no chills, No  unintentional weight changes HEAD/EYES/EARS/NOSE/THROAT: No headache, no vision change, no hearing change, resolved sore throat, (+) sinus pressure as per HPI CARDIAC: No chest pain, no pressure/palpitations, no orthopnea RESPIRATORY: occasional dry cough, No  shortness of breath/wheeze    Exam:  BP 114/68 mmHg  Pulse 64  Temp(Src) 98.8 F (37.1 C) (Oral)  Wt 199 lb (90.266 kg) Constitutional: VSS, see above. General Appearance: alert, well-developed, well-nourished, NAD Eyes: Normal lids and conjunctive, non-icteric sclera, PERRLA Ears, Nose, Mouth, Throat: Normal external inspection ears/nares/mouth/lips/gums, TM normal bilaterally, MMM, posterior pharynx No  erythema No  Exudate, (+) maxillary tenderness bilaterally Neck: No masses, trachea midline. No thyroid enlargement/tenderness/mass appreciated. No lymphadenopathy Respiratory: Normal respiratory effort. no wheeze, no rhonchi, no rales  Procedure: OMT lymphatic technique to sinuses and thoracic outlet to (+) patient relief  ASSESSMENT/PLAN: take all antibiotics even if feeling better, continue FLonase at hom, try antihistamine plus decongestant such as ZyrtecD  Bacterial sinusitis - Plan: amoxicillin-clavulanate (AUGMENTIN) 875-125 MG tablet   Return if symptoms worsen or fail to improve.

## 2015-11-18 ENCOUNTER — Telehealth: Payer: Self-pay | Admitting: Osteopathic Medicine

## 2015-11-18 MED ORDER — BECLOMETHASONE DIPROPIONATE 40 MCG/ACT IN AERS: 1.0000 | INHALATION_SPRAY | Freq: Two times a day (BID) | RESPIRATORY_TRACT | Status: DC

## 2015-11-18 NOTE — Telephone Encounter (Signed)
I sent in Qvar, this is an inhaled steroid for asthma, I don't believe we talked at any great length about her asthma symptoms at any of her visits, if this medications switch is causing any problems, she will need to come in for a visit to talk more about her asthma history. Thanks.

## 2015-11-18 NOTE — Telephone Encounter (Signed)
Pt called and stated her insurance will no longer cover Symbicort so she needs her meds changed to something they will cover and they gave her the options of Qvar, Brio, or Advar discus and she is wanting to know if you can switch her to one of those instead of the Symbicort. Thanks

## 2016-01-10 ENCOUNTER — Encounter: Payer: Self-pay | Admitting: Osteopathic Medicine

## 2016-01-10 ENCOUNTER — Ambulatory Visit (INDEPENDENT_AMBULATORY_CARE_PROVIDER_SITE_OTHER): Payer: PRIVATE HEALTH INSURANCE | Admitting: Osteopathic Medicine

## 2016-01-10 VITALS — BP 118/63 | HR 61 | Temp 98.3°F | Ht 65.0 in | Wt 197.0 lb

## 2016-01-10 DIAGNOSIS — B9789 Other viral agents as the cause of diseases classified elsewhere: Principal | ICD-10-CM

## 2016-01-10 DIAGNOSIS — J069 Acute upper respiratory infection, unspecified: Secondary | ICD-10-CM

## 2016-01-10 MED ORDER — METHYLPREDNISOLONE 4 MG PO TBPK
ORAL_TABLET | ORAL | Status: DC
Start: 2016-01-10 — End: 2016-06-10

## 2016-01-10 MED ORDER — HYDROCODONE-HOMATROPINE 5-1.5 MG/5ML PO SYRP: 5.0000 mL | ORAL_SOLUTION | Freq: Four times a day (QID) | ORAL | Status: DC | PRN

## 2016-01-10 MED ORDER — BENZONATATE 200 MG PO CAPS: 200.0000 mg | ORAL_CAPSULE | Freq: Three times a day (TID) | ORAL | Status: DC | PRN

## 2016-01-10 MED ORDER — IPRATROPIUM BROMIDE 0.03 % NA SOLN: 2.0000 | Freq: Three times a day (TID) | NASAL | Status: DC

## 2016-01-10 NOTE — Progress Notes (Signed)
HPI: Candice Hanson is a 40 y.o. female who presents to [facility]  today for chief complaint of:  Chief Complaint  Patient presents with  . Cough    . Quality: coughing, chest tightness . Assoc signs/symptoms: see ROS - hoarse, chest tightness, ear fullness . Duration: 2 weeks ago then better then sick now 2 days days . Modifying factors: has tried the following OTC/Rx medications: mucinex, alka-seltzer cold/cough, honey and warm water, nasonex . Context:  (+) sick contacts at home and work    Past medical, social and family history reviewed. Current medications and allergies reviewed.     Review of Systems: CONSTITUTIONAL: 101 usually at night fever/chills HEAD/EYES/EARS/NOSE/THROAT: yes headache, no vision change or hearing change, yes sore throat CARDIAC: No chest pain/pressure/palpitations RESPIRATORY: yes cough, no shortness of breath GASTROINTESTINAL: no nausea, no vomiting, no abdominal pain, no diarrhea MUSCULOSKELETAL: no myalgia/arthralgia   Exam:  BP 118/63 mmHg  Pulse 61  Temp(Src) 98.3 F (36.8 C) (Oral)  Ht 5' 5"  (1.651 m)  Wt 197 lb (89.359 kg)  BMI 32.78 kg/m2 Constitutional: VSS, see above. General Appearance: alert, well-developed, well-nourished, NAD Eyes: Normal lids and conjunctive, non-icteric sclera, PERRLA Ears, Nose, Mouth, Throat: Normal external inspection ears/nares/mouth/lips/gums, normal TM, MMM;       posterior pharynx without erythema, with exudate Neck: No masses, trachea midline. normal lymph nodes Respiratory: Normal respiratory effort. No  wheeze/rhonchi/rales Cardiovascular: S1/S2 normal, no murmur/rub/gallop auscultated. RRR.   No results found for this or any previous visit (from the past 72 hour(s)). No results found.   ASSESSMENT/PLAN:  Viral URI with cough - Plan: ipratropium (ATROVENT) 0.03 % nasal spray, benzonatate (TESSALON) 200 MG capsule, HYDROcodone-homatropine (HYCODAN) 5-1.5  MG/5ML syrup, methylPREDNISolone (MEDROL DOSEPAK) 4 MG TBPK tablet    Return if symptoms worsen or fail to improve, call us Monday or Tuesday.

## 2016-01-10 NOTE — Patient Instructions (Signed)
DR. Redgie Grayer HOME CARE INSTRUCTIONS: VIRAL ILLNESSES - ACHES/PAINS, SORE THROAT, SINUSITIS, COUGH, GASTRITIS   FRIST, A FEW NOTES ON OVER-THE-COUNTER MEDICATIONS!   USE CAUTION - MANY OVER-THE-COUNTER MEDICATIONS COME IN COMBINATIONS OF MULTIPLE GENERIC MEDICINES. FOR INSTANCE, NYQUIL HAS TYLENOL + COUGH MEDICINE + AN ANTIHISTAMINE, SO BE CAREFUL YOU'RE NOT TAKING A COMBINATION MEDICINE WHICH CONTAINS MEDICATIONS YOU'RE ALSO TAKING SEPARATELY (LIKE NYQUIL SYRUP PLUS TYLENOL PILLS).   YOUR PHARMACIST CAN HELP YOU AVOID MEDICATION INTERACTIONS AND DUPLICATIONS - ASK FOR THEIR HELP IF YOU ARE CONFUSED OR UNSURE ABOUT WHAT TO PURCHASE OVER-THE-COUNTER!   REMEMBER - IF YOU'RE EVER CONCERNED ABOUT MEDICATION SIDE EFFECTS, OR IF YOU'RE EVER CONCERNED YOUR SYMPTOMS ARE GETTING WORSE DESPITE TREATMENT, PLEASE CALL THE DOCTOR'S OFFICE! IF AFTER-HOURS, YOU CAN BE SEEN IN URGENT CARE OR, FOR SEVERE ILLNESS, PLEASE GO TO THE EMERGENCY ROOM.   TREAT SINUS CONGESTION, RUNNY NOSE & POSTNASAL DRIP:  Treat to increase airflow through sinuses, decrease congestion pain and prevent bacterial growth!   Remember, only 0.5-2% of sinus infections are due to a bacteria, the rest are due to a virus (usually the common cold) which will not get better with antibiotics!  NASAL SPRAYS: generally safe and should not interact with other medicines. Can take any of these medications, either alone or together... FLONASE (FLUTICASONE) - 2 sprays in each nostril twice per day (also a great allergy medicine to use long-term!) AFRIN (OXYMETOLAZONE) - use sparingly because it will cause rebound congestion, NEVER USE IN KIDS  SALINE NASAL SPRAY- no limit, but avoid use after other nasal sprays or it can wash the medicine away PRESCRTIPTION ATROVENT - as directed on prescription bottle  ANTIHISTAMINES: Helps dry out runny nose and decreases postnasal drip. Benadryl may cause drowsiness but other preparations should not be  as sedating. Certain kinds are not as safe in 39-year-olds individuals. OK to use unless Dr A says otherwise.  Only use one of the following... BENADRYL (DIPHENHYDRAMINE) - 25-50 mg every 6 hours ZYRTEC (CETIRIZINE) - 5-10 mg daily CLARITIN (LORATIDINE) - less potent. 10 mg daily ALLEGRA (FEXOFENADINE) - least likely to cause drowsiness! 180 mg daily or 60 mg twice per day  DECONGESTANTS: Helps dry out runny nose and helps with sinus pain. May cause insomnia, or sometimes elevated heart rate. Can cause problems if used often in people with high blood pressure. OK to use unless Dr A says otherwise. NEVER USE IN KIDS UNDER 39 YEARS OLD. Only use one of the following... SUDAFED (PSEUDOEPHEDINE) - 60 mg every 4 - 6 hours, also comes in 120 mg extended release every 12 hours, maximum 240 mg in 24 hours.  SUDAED PE (PHENYLEPHRINE) - 10 mg every 4 - 6 hours, maximum 60 mg per day  COMBINATIONS OF ABOVE ANTIHISTAMINES + DECONGESTANTS: these usually require you to show your ID at the pharmacy counter. You can also purchase these medicines separately as noted above.  Only use one of the following... ZYRTEC-D (CETIRIZINE + PSEUDOEPHEDRINE) - 12 hour formulation as directed CLARITIN-D (LORATIDINE + PSEUDOEPHEDRINE) - 12 and 24 hour formulations as directed ALLEGRA-D (FEXOFENADINE + PSEUDOEPHEDRINE) - 12 and 24 hour formulations as directed  PRESCRIPTION TREATMENT FOR SINUS PROBLEMS: Can include nasal sprays, pills, or antibiotics in the case of true bacterial infection. Not everyone needs an antibiotic but there are other medicines which will help you feel better while your body fights the infection!    TREAT COUGH & SORE THROAT:  Remember, cough is the body's way of protecting your  airways and lungs, it's a hard-wired reflex that is tough for medicines to treat!   Irritation to the airways will cause cough. This irritation is usually caused by upper airway problems like postnasal drip (treat as above)  and viral sore throat, but in severe cases can be due to lower airway problems like bronchitis or pneumonia, which a doctor can usually hear on exam of your lungs or see on an X-ray.  Sore throat is almost always due to a virus, but occasionally caused by certain strains of Strep, which requires antibiotics.   Exercise and smoking may make cough worse - take it easy while you're sick, and QUIT SMOKING!   Cough due to viral infection can linger for 2-3 weeks or so. If you're coughing longer than you think you should, or if the cough is severe, please make an appointment in the office - you may need a chest X-ray.  EXPECTORANT: Used to help clear airways, take these with PLENTY of water to help thin mucus secretions and make the mucus easier to cough up  Only use one of the following... ROBITUSSIN (DEXTROMETHORPHAN OR GUAIFENISEN depending on formulation)  MUCINEX (GUAIFENICEN) - usually longer acting  COUGH DROPS/LOZENGES: Whichever over-the-counter agent you prefer!  Here are some suggestions for ingredients to look for (can take both)... BENZOCAINE - numbing effect, also in [place] - cooling effect  HONEY: has gone head-to-head in several clinical trials with prescription cough medicines and found to be equally effective! Try 1 Teaspoon Honey + 2 Drops Lemon Juice, as much as you want to use. NONE FOR KIDS UNDER AGE 39  HERBAL TEA: There are certain ingredients which help "coat the throat" to relieve pain  such as ELM BARK, LICORICE ROOT, MARSHMALLOW ROOT  PRESCRIPTION TREATMENT FOR COUGH: Reserved for severe cases. Can include pills, syrups or inhalers.    TREAT ACHES & PAINS, FEVER:  Illness causes aches, pains and fever as your body increases its natural inflammation response to help fight the infection.   Rest, good hydration and nutrition, and taking anti-inflammatory medications will help.   Remember: a true fever is a temperature 100.4 or  higher. If you have a fever that is 105.0 or higher, that is a dangerous level and needs medical attention in the office or in the ER!  Can take both of these together if it's ok with your doctor... IBUPROFEN - 400-800 mg every 6 - 8 hours. Ibuprofen and similar medications can cause problems for people with heart disease or history of stomach ulcers, check with Dr A first if you're concerned. Lower doses are usually safe and effective.  TYLENOL (ACETAMINOPHEN) - [phone] mg every 6 hours. It won't cause problems with heart or stomach.     TREAT GASTROINTESTINAL SYMPTOMS:  Hydrate, hydrate, hydrate! Try drinking Gatorade/Powerade, broth/soup. Avoid milk and juice, these can make diarrhea worse. You can drink water, of course, but if you are also having vomiting and loose stool you are losing electrolytes which water alone can't replace.  IMMODIUM (LOPERAMIDE) - as directed on the bottle to help with loose stool PRESCRIPTION ZOFRAN (ONDANSETRON) OR PHENERGAN (PROMETHAZINE) - as directed to help nausea and vomiting. Try taking these before you eat if you are having trouble keeping food down.     REMEMBER - THE MOST IMPORTANT THINGS YOU CAN DO TO AVOID CATCHING OR SPREADING ILLNESS INCLUDE:   COVER YOUR COUGH WITH YOUR ARM, NOT WITH YOUR HANDS!   DISINFECT COMMONLY USED SURFACES (SUCH AS  TELEPHONES & DOORKNOBS) WHEN YOU OR SOMEONE CLOSE TO YOU IS FEELING SICK!   BE SURE VACCINES ARE UP TO DATE - GET A FLU SHOT EVERY YEAR! THE FLU CAN BE DEADLY FOR BABIES, ELDERLY FOLKS, AND PEOPLE WITH WEAK IMMUNE SYSTEMS - YOU SHOULD BE VACCINATED TO HELP PREVENT YOURSELF FROM GETTING SICK, BUT ALSO TO PREVENT SOMEONE ELSE FROM GETTING AN INFECTION WHICH MAY HOSPITALIZE OR KILL THEM.  GOOD NUTRITION AND HEALTHY LIFESTYLE WILL HELP YOUR IMMUNE SYSTEM YEAR-ROUND! THERE IS NO MAGIC SUPPLEMENT!  AND ABOVE ALL - [place] YOUR HANDS OFTEN!

## 2016-06-06 ENCOUNTER — Other Ambulatory Visit: Payer: Self-pay | Admitting: Osteopathic Medicine

## 2016-06-06 DIAGNOSIS — J4530 Mild persistent asthma, uncomplicated: Secondary | ICD-10-CM

## 2016-06-10 ENCOUNTER — Ambulatory Visit (INDEPENDENT_AMBULATORY_CARE_PROVIDER_SITE_OTHER): Payer: PRIVATE HEALTH INSURANCE | Admitting: Osteopathic Medicine

## 2016-06-10 ENCOUNTER — Encounter: Payer: Self-pay | Admitting: Osteopathic Medicine

## 2016-06-10 VITALS — BP 118/71 | HR 66 | Ht 65.0 in | Wt 196.0 lb

## 2016-06-10 DIAGNOSIS — J4530 Mild persistent asthma, uncomplicated: Secondary | ICD-10-CM | POA: Diagnosis not present

## 2016-06-10 DIAGNOSIS — Z1211 Encounter for screening for malignant neoplasm of colon: Secondary | ICD-10-CM | POA: Insufficient documentation

## 2016-06-10 DIAGNOSIS — Z0000 Encounter for general adult medical examination without abnormal findings: Secondary | ICD-10-CM | POA: Diagnosis not present

## 2016-06-10 DIAGNOSIS — Z23 Encounter for immunization: Secondary | ICD-10-CM | POA: Diagnosis not present

## 2016-06-10 DIAGNOSIS — Z975 Presence of (intrauterine) contraceptive device: Secondary | ICD-10-CM

## 2016-06-10 LAB — COMPLETE METABOLIC PANEL WITH GFR
ALBUMIN: 4 g/dL (ref 3.6–5.1)
ALK PHOS: 63 U/L (ref 33–115)
ALT: 16 U/L (ref 6–29)
AST: 17 U/L (ref 10–30)
BUN: 11 mg/dL (ref 7–25)
CALCIUM: 9.4 mg/dL (ref 8.6–10.2)
CO2: 24 mmol/L (ref 20–31)
CREATININE: 0.86 mg/dL (ref 0.50–1.10)
Chloride: 105 mmol/L (ref 98–110)
GFR, Est African American: >89 mL/min (ref >=60)
GFR, Est Non African American: 85 mL/min (ref >=60)
GLUCOSE: 87 mg/dL (ref 65–99)
Potassium: 4.3 mmol/L (ref 3.5–5.3)
SODIUM: 140 mmol/L (ref 135–146)
TOTAL PROTEIN: 6.8 g/dL (ref 6.1–8.1)
Total Bilirubin: 0.6 mg/dL (ref 0.2–1.2)

## 2016-06-10 LAB — CBC WITH DIFFERENTIAL/PLATELET
Basophils Absolute: 88 cells/uL (ref 0–200)
Basophils Relative: 1 %
EOS ABS: 616 {cells}/uL — AB (ref 15–500)
Eosinophils Relative: 7 %
HEMATOCRIT: 42.1 % (ref 35.0–45.0)
HEMOGLOBIN: 14.3 g/dL (ref 11.7–15.5)
LYMPHS PCT: 35 %
Lymphs Abs: 3080 cells/uL (ref 850–3900)
MCH: 29.1 pg (ref 27.0–33.0)
MCHC: 34 g/dL (ref 32.0–36.0)
MCV: 85.7 fL (ref 80.0–100.0)
MONO ABS: 704 {cells}/uL (ref 200–950)
MPV: 10.7 fL (ref 7.5–12.5)
Monocytes Relative: 8 %
NEUTROS PCT: 49 %
Neutro Abs: 4312 cells/uL (ref 1500–7800)
Platelets: 275 10*3/uL (ref 140–400)
RBC: 4.91 MIL/uL (ref 3.80–5.10)
RDW: 13 % (ref 11.0–15.0)
WBC: 8.8 10*3/uL (ref 3.8–10.8)

## 2016-06-10 LAB — LIPID PANEL
CHOLESTEROL: 185 mg/dL (ref 125–200)
HDL: 41 mg/dL — ABNORMAL LOW (ref >=46)
LDL Cholesterol: 121 mg/dL (ref <130)
Total CHOL/HDL Ratio: 4.5 Ratio (ref <=5.0)
Triglycerides: 115 mg/dL (ref <150)
VLDL: 23 mg/dL (ref <30)

## 2016-06-10 LAB — TSH: TSH: 1.84 m[IU]/L

## 2016-06-10 MED ORDER — ALBUTEROL SULFATE HFA 108 (90 BASE) MCG/ACT IN AERS: 1.0000 | INHALATION_SPRAY | RESPIRATORY_TRACT | 6 refills | Status: DC | PRN

## 2016-06-10 MED ORDER — BECLOMETHASONE DIPROPIONATE 40 MCG/ACT IN AERS: 1.0000 | INHALATION_SPRAY | Freq: Two times a day (BID) | RESPIRATORY_TRACT | 12 refills | Status: DC

## 2016-06-10 MED ORDER — MONTELUKAST SODIUM 10 MG PO TABS: 10.0000 mg | ORAL_TABLET | Freq: Every day | ORAL | 3 refills | Status: DC

## 2016-06-10 NOTE — Progress Notes (Signed)
HPI: Candice Hanson is a 40 y.o. female  who presents to [facility] today, 06/10/16,  for chief complaint of:  Chief Complaint  Patient presents with  . Annual Exam     See below for preventive care reviewed. And has no major complaints today.  Asthma: Doing well on the Qvar once to twice daily, uses rescue inhaler very sparingly, requests refill on Singulair.   Past medical, surgical, social and family history reviewed: Past Medical History:  Diagnosis Date  . Allergy   . Asthma   . Migraine headache    Past Surgical History:  Procedure Laterality Date  . WISDOM TOOTH EXTRACTION     Social History  Substance Use Topics  . Smoking status: Former Smoker    Quit date: 10/20/1999  . Smokeless tobacco: Not on file  . Alcohol use 0.0 oz/week     Comment: 1/week   Family History  Problem Relation Age of Onset  . Adopted: Yes  . Family history unknown: Yes     Current medication list and allergy/intolerance information reviewed:   Current Outpatient Prescriptions  Medication Sig Dispense Refill  . albuterol (PROVENTIL HFA;VENTOLIN HFA) 108 (90 BASE) MCG/ACT inhaler Inhale 1-2 puffs into the lungs as needed for wheezing or shortness of breath.    . beclomethasone (QVAR) 40 MCG/ACT inhaler Inhale 1-2 puffs into the lungs 2 (two) times daily. 1 Inhaler 12  . benzonatate (TESSALON) 200 MG capsule Take 1 capsule (200 mg total) by mouth 3 (three) times daily as needed for cough. 30 capsule 0  . fexofenadine (ALLEGRA) 180 MG tablet Take 180 mg by mouth.    . Fluticasone Propionate (FLONASE NA) Place into the nose. Once per nostril. Once daily - Every other day    . HYDROcodone-homatropine (HYCODAN) 5-1.5 MG/5ML syrup Take 5 mLs by mouth every 6 (six) hours as needed for cough. 100 mL 0  . ipratropium (ATROVENT) 0.03 % nasal spray Place 2 sprays into both nostrils 3 (three) times daily. 30 mL 0  . levonorgestrel (MIRENA) 20 MCG/24HR IUD 1 each by  Intrauterine route once.    . methylPREDNISolone (MEDROL DOSEPAK) 4 MG TBPK tablet 6-day pack as directed 21 tablet 0  . montelukast (SINGULAIR) 10 MG tablet TAKE 1 TABLET (10 MG TOTAL) BY MOUTH AT BEDTIME. 90 tablet 0  . Multiple Vitamins-Minerals (MULTIVITAMIN ADULT PO) Take by mouth.     No current facility-administered medications for this visit.    No Known Allergies    Review of Systems:  Constitutional:  No  fever, no chills, No recent illness, No unintentional weight changes. No significant fatigue.   HEENT: No  headache, no vision change, no hearing change, No sore throat, No  sinus pressure  Cardiac: No  chest pain, No  pressure, No palpitations, No  Orthopnea  Respiratory:  No  shortness of breath. No  Cough  Gastrointestinal: No  abdominal pain, No  nausea, No  vomiting,  No  blood in stool, No  diarrhea, No  constipation   Musculoskeletal: No new myalgia/arthralgia  Genitourinary: No  incontinence, No  abnormal genital bleeding, No abnormal genital discharge  Skin: No  Rash, No other wounds/concerning lesions  Hem/Onc: No  easy bruising/bleeding, No  abnormal lymph node  Endocrine: No cold intolerance,  No heat intolerance. No polyuria/polydipsia/polyphagia   Neurologic: No  weakness, No  dizziness, No  slurred speech/focal weakness/facial droop  Psychiatric: No  concerns with depression, No  concerns with anxiety, No sleep  problems, No mood problems  Exam:  BP 118/71   Pulse 66   Ht 5' 5"  (1.651 m)   Wt 196 lb (88.9 kg)   BMI 32.62 kg/m   Constitutional: VS see above. General Appearance: alert, well-developed, well-nourished, NAD  Eyes: Normal lids and conjunctive, non-icteric sclera  Ears, Nose, Mouth, Throat: MMM, Normal external inspection ears/nares/mouth/lips/gums. TM normal bilaterally. Pharynx/tonsils no erythema, no exudate. Nasal mucosa normal.   Neck: No masses, trachea midline. No thyroid enlargement. No tenderness/mass appreciated. No  lymphadenopathy  Respiratory: Normal respiratory effort. no wheeze, no rhonchi, no rales  Cardiovascular: S1/S2 normal, no murmur, no rub/gallop auscultated. RRR. No lower extremity edema.   Gastrointestinal: Nontender, no masses. No hepatomegaly, no splenomegaly. No hernia appreciated. Bowel sounds normal. Rectal exam deferred.   Musculoskeletal: Gait normal. No clubbing/cyanosis of digits.   Neurological: No cranial nerve deficit on limited exam. Motor and sensation intact and symmetric. Cerebellar reflexes intact. Normal balance/coordination. No tremor.   Skin: warm, dry, intact. No rash/ulcer. No concerning nevi or subq nodules on limited exam.    Psychiatric: Normal judgment/insight. Normal mood and affect. Oriented x3.      ASSESSMENT/PLAN:   Annual physical exam - Plan: CBC with Differential/Platelet, COMPLETE METABOLIC PANEL WITH GFR, Lipid panel, TSH, VITAMIN D 25 Hydroxy (Vit-D Deficiency, Fractures)  IUD (intrauterine device) in place - Mirena inserted 08/2015  Asthma, chronic, mild persistent, uncomplicated - Pt declines alternate/deescalation of therapy - Plan: beclomethasone (QVAR) 40 MCG/ACT inhaler, albuterol (PROVENTIL HFA;VENTOLIN HFA) 108 (90 Base) MCG/ACT inhaler, montelukast (SINGULAIR) 10 MG tablet  Need for prophylactic vaccination and inoculation against influenza - Plan: Flu Vaccine QUAD 36+ mos IM  Need for tetanus booster - Plan: Tdap vaccine greater than or equal to 7yo IM    FEMALE PREVENTIVE CARE Updated 06/10/16   ANNUAL SCREENING/COUNSELING Tobacco - no - quit 12 years ago, maybe a pack a week when she was smoking  Alcohol - social drinker Diet/Exercise - HEALTHY HABITS DISCUSSED TO DECREASE CV RISK - walking gods several times a week, interval training cardio Depression - PQH2 Negative Domestic violence concerns - no HTN SCREENING - SEE VITALS Vaccination status - SEE BELOW  SEXUAL HEALTH Sexually active in the past year -Yes with  female. STI - The patient denies history of sexually transmitted disease. STI testing today? - no  INFECTIOUS DISEASE SCREENING HIV - all adults 15-65 - does not need GC/CT - sexually active - does not need HepC - DOB [birth year] - does not need TB - does not need  DISEASE SCREENING Lipid - needs DM2 - needs Osteoporosis - does not need  CANCER SCREENING Cervical - does not need - previous negative Breast - does not need - no known FH  Lung - does not need Colon - does not need - no known FH  ADULT VACCINATION Influenza - was given Td - was given HPV - was not indicated Zoster - was not indicated Pneumonia - was not indicated  OTHER Fall - exercise and Vit D age 43+ - does not need Consider ASA - age 64-59 - does not need    Visit summary with medication list and pertinent instructions was printed for patient to review. All questions at time of visit were answered - patient instructed to contact office with any additional concerns. ER/RTC precautions were reviewed with the patient. Follow-up plan: Return in about 1 year (around 06/10/2017), or sooner if needed, for ANNUAL PHYSICAL (can get labs done in advance if desired -  call us 1 - 2 weeks prior to your appt) .

## 2016-06-11 LAB — VITAMIN D 25 HYDROXY (VIT D DEFICIENCY, FRACTURES): VIT D 25 HYDROXY: 30 ng/mL (ref 30–100)

## 2017-05-14 ENCOUNTER — Ambulatory Visit (INDEPENDENT_AMBULATORY_CARE_PROVIDER_SITE_OTHER): Payer: 59 | Admitting: Osteopathic Medicine

## 2017-05-14 ENCOUNTER — Encounter: Payer: Self-pay | Admitting: Osteopathic Medicine

## 2017-05-14 ENCOUNTER — Ambulatory Visit (INDEPENDENT_AMBULATORY_CARE_PROVIDER_SITE_OTHER): Payer: 59

## 2017-05-14 VITALS — BP 109/72 | HR 94 | Temp 98.1°F | Ht 65.0 in | Wt 186.0 lb

## 2017-05-14 DIAGNOSIS — S43422A Sprain of left rotator cuff capsule, initial encounter: Secondary | ICD-10-CM | POA: Diagnosis not present

## 2017-05-14 DIAGNOSIS — M542 Cervicalgia: Secondary | ICD-10-CM | POA: Diagnosis not present

## 2017-05-14 DIAGNOSIS — B029 Zoster without complications: Secondary | ICD-10-CM

## 2017-05-14 DIAGNOSIS — M25512 Pain in left shoulder: Secondary | ICD-10-CM

## 2017-05-14 MED ORDER — OXYCODONE-ACETAMINOPHEN 5-325 MG PO TABS: 1.0000 | ORAL_TABLET | Freq: Three times a day (TID) | ORAL | 0 refills | Status: DC | PRN

## 2017-05-14 MED ORDER — PREDNISONE 10 MG (48) PO TBPK: ORAL_TABLET | Freq: Every day | ORAL | 0 refills | Status: DC

## 2017-05-14 MED ORDER — VALACYCLOVIR HCL 1 G PO TABS
1000.0000 mg | ORAL_TABLET | Freq: Three times a day (TID) | ORAL | 0 refills | Status: DC
Start: 2017-05-14 — End: 2017-08-11

## 2017-05-14 MED ORDER — MELOXICAM 15 MG PO TABS: 15.0000 mg | ORAL_TABLET | Freq: Every day | ORAL | 0 refills | Status: DC

## 2017-05-14 NOTE — Progress Notes (Signed)
HPI: Candice Hanson is a 41 y.o. female  who presents to [facility] today, 05/14/17,  for chief complaint of:  Chief Complaint  Patient presents with  . Shoulder Pain  . Rash    [place] 5 days [place], tree fell in front of their coronary and into this. Patient had reached around with her left arm to steady herself, thinks she may have torqued her shoulder. Has some pain in L shoulder and in neck.  Also over the past 2 days, new rash on left back and left side of chest, burning pain, starting to show some blisters.   Past medical history, surgical history, social history and family history reviewed.  Patient Active Problem List   Diagnosis Date Noted  . Annual physical exam 06/10/2016  . Intrauterine contraceptive device status 06/06/2015  . History of migraine 06/06/2015  . Irregular uterine bleeding 06/06/2015  . Asthma, chronic 06/06/2015  . Lipid screening 06/06/2015    Current medication list and allergy/intolerance information reviewed.   Current Outpatient Prescriptions on File Prior to Visit  Medication Sig Dispense Refill  . albuterol (PROVENTIL HFA;VENTOLIN HFA) 108 (90 Base) MCG/ACT inhaler Inhale 1-2 puffs into the lungs every 4 (four) hours as needed for wheezing or shortness of breath. 1 Inhaler 6  . beclomethasone (QVAR) 40 MCG/ACT inhaler Inhale 1-2 puffs into the lungs 2 (two) times daily. 1 Inhaler 12  . Fluticasone Propionate (FLONASE NA) Place into the nose. Once per nostril. Once daily - Every other day    . levonorgestrel (MIRENA) 20 MCG/24HR IUD 1 each by Intrauterine route once.    . montelukast (SINGULAIR) 10 MG tablet Take 1 tablet (10 mg total) by mouth at bedtime. 90 tablet 3  . Multiple Vitamins-Minerals (MULTIVITAMIN ADULT PO) Take by mouth.     No current facility-administered medications on file prior to visit.    No Known Allergies    Review of Systems:  Constitutional: No  recent illness  HEENT: No  headache, no vision change  Cardiac: No  chest pain, No  pressure, No palpitations  Respiratory:  No  shortness of breath.  Musculoskeletal: +new myalgia/arthralgia  Skin: +Rash  Neurologic: No  weakness, No  Dizziness   Exam:  BP 109/72   Pulse 94   Temp 98.1 F (36.7 C)   Ht 5' 5"  (1.651 m)   Wt 186 lb (84.4 kg)   BMI 30.95 kg/m   Constitutional: VS see above. General Appearance: alert, well-developed, well-nourished, NAD  Eyes: Normal lids and conjunctive, non-icteric sclera  Ears, Nose, Mouth, Throat: MMM, Normal external inspection ears/nares/mouth/lips/gums.  Neck: No masses, trachea midline.   Respiratory: Normal respiratory effort. no wheeze, no rhonchi, no rales  Cardiovascular: S1/S2 normal, no murmur, no rub/gallop auscultated. RRR.   Musculoskeletal: Gait normal. Grip strength equal bilaterally. Spurling's test negative to the left. Positive paraspinal tenderness to lower cervical and upper thoracic region particularly on the left. Shoulder range of motion somewhat limited in abduction without pain but negative drop arm test, negative empty can test, negative cross arm test.  Neurological: Normal balance/coordination. No tremor.  Skin: warm, dry, intact.   Psychiatric: Normal judgment/insight. Normal mood and affect. Oriented x3.   Imaging: No results found.   ASSESSMENT/PLAN:   Sprain of left rotator cuff capsule, initial encounter - If it will likely sprain and tear. We'll get set up with physical therapy, pain medication and steroid burst. - Plan: predniSONE (STERAPRED UNI-PAK  48 TAB) 10 MG (48) TBPK tablet, meloxicam (MOBIC) 15 MG tablet, oxyCODONE-acetaminophen (ROXICET) 5-325 MG tablet, DG Shoulder Left, Ambulatory referral to Physical Therapy  Neck pain - Plan: DG Cervical Spine Complete, Ambulatory referral to Physical Therapy  Herpes zoster without complication - Plan: oxyCODONE-acetaminophen (ROXICET) 5-325 MG  tablet, valACYclovir (VALTREX) 1000 MG tablet     Follow-up plan: Return in about 2 weeks (around 05/28/2017) for with sports medicine for shoulder pain if no better .  Visit summary with medication list and pertinent instructions was printed for patient to review, alert [place] if any changes needed. All questions at time of visit were answered - patient instructed to contact office with any additional concerns. ER/RTC precautions were reviewed with the patient and understanding verbalized.

## 2017-06-10 ENCOUNTER — Other Ambulatory Visit: Payer: Self-pay | Admitting: Osteopathic Medicine

## 2017-06-10 DIAGNOSIS — S43422A Sprain of left rotator cuff capsule, initial encounter: Secondary | ICD-10-CM

## 2017-08-11 ENCOUNTER — Ambulatory Visit (INDEPENDENT_AMBULATORY_CARE_PROVIDER_SITE_OTHER): Payer: 59 | Admitting: Osteopathic Medicine

## 2017-08-11 ENCOUNTER — Encounter: Payer: Self-pay | Admitting: Osteopathic Medicine

## 2017-08-11 VITALS — BP 115/80 | HR 105 | Wt 192.0 lb

## 2017-08-11 DIAGNOSIS — J4530 Mild persistent asthma, uncomplicated: Secondary | ICD-10-CM | POA: Diagnosis not present

## 2017-08-11 DIAGNOSIS — Z0000 Encounter for general adult medical examination without abnormal findings: Secondary | ICD-10-CM

## 2017-08-11 DIAGNOSIS — D72829 Elevated white blood cell count, unspecified: Secondary | ICD-10-CM

## 2017-08-11 DIAGNOSIS — Z23 Encounter for immunization: Secondary | ICD-10-CM

## 2017-08-11 MED ORDER — BECLOMETHASONE DIPROPIONATE 40 MCG/ACT IN AERS: 1.0000 | INHALATION_SPRAY | Freq: Two times a day (BID) | RESPIRATORY_TRACT | 12 refills | Status: DC

## 2017-08-11 MED ORDER — MONTELUKAST SODIUM 10 MG PO TABS: 10.0000 mg | ORAL_TABLET | Freq: Every day | ORAL | 3 refills | Status: DC

## 2017-08-11 MED ORDER — ALBUTEROL SULFATE HFA 108 (90 BASE) MCG/ACT IN AERS: 1.0000 | INHALATION_SPRAY | RESPIRATORY_TRACT | 6 refills | Status: DC | PRN

## 2017-08-11 NOTE — Patient Instructions (Signed)
Weight loss: important things to remember  It is hard work! You will have setbacks, but don't get discouraged. The goal is not short-term success, it is long-term health.   Looking at the numbers is important to track your progress and set goals, but how you are feeling and your overall health are the most important things! BMI and pounds and calories and miles logged aren't everything - they are tools to help [place] reach your goals.  You can do this!!!   Things to remember for exercise for weight loss:   Please note - I am not a certified personal trainer. I can present you with ideas and general workout goals, but an exercise program is largely up to you. Find something you can stick with, and something you enjoy!   As you progress in your exercise regimen think about gradually increasing the following, week by week:   intensity (how strenuous is your workout)  frequency (how often you are exercising)  duration (how many minutes at a time you are exercising)  Walking for 20 minutes a day is certainly better than nothing, but more strenuous exercise will develop better cardiovascular fitness.   interval training (high-intensity alternating with low-intensity, think walk/jog rather than just walk)  muscle strengthening exercises (weight lifting, calisthenics, yoga) - this also helps prevent osteoporosis!   Things to remember for diet changes for weight loss:   Please note - I am not a certified dietician. I can present you with ideas and general diet goals, but a meal plan is largely up to you. I am happy to refer you to a dietician who can give you a detailed meal plan.  Apps/logs are crucial to track how you're eating! It's not realistic to be logging everything you eat forever, but when you're starting a healthy eating lifestyle it's very helpful, and checking in with logs now and then helps you stick to your program!   Calorie restriction with the goal weight loss of no more than one  to one and a half pounds per week.   Increase lean protein such as chicken, fish, [place].   Decrease fatty foods such as dairy, butter, fried foods   Decrease sugary foods. Avoid sugary drinks such as soda or juice.  Increase fiber found in fruit and vegetables, nuts, whole wheat

## 2017-08-11 NOTE — Progress Notes (Signed)
HPI: Candice Hanson is a 41 y.o. female  who presents to [facility] today, 08/11/17,  for chief complaint of:  Chief Complaint  Patient presents with  . Annual Exam    See below for preventive care reviewed. Patient has no major complaints today But would like to discuss strategies for weight loss which she has struggled with a bit.  Asthma: Doing well on the Qvar once to twice daily, uses rescue inhaler very sparingly, requests refill on Singulair.  Reports recent rash on lip, thinks may be due to makeup remover which was switched and then this went away though she does have some appetite concern for possible HSV, though long-term monogamous with husband and very low risk.  G3P3    Past medical, surgical, social and family history reviewed: Past Medical History:  Diagnosis Date  . Allergy   . Asthma   . Migraine headache    Past Surgical History:  Procedure Laterality Date  . WISDOM TOOTH EXTRACTION     Social History  Substance Use Topics  . Smoking status: Former Smoker    Quit date: 10/20/1999  . Smokeless tobacco: Never Used  . Alcohol use 0.0 oz/week     Comment: 1/week   Family History  Problem Relation Age of Onset  . Adopted: Yes  . Family history unknown: Yes     Current medication list and allergy/intolerance information reviewed:   Current Outpatient Prescriptions  Medication Sig Dispense Refill  . albuterol (PROVENTIL HFA;VENTOLIN HFA) 108 (90 Base) MCG/ACT inhaler Inhale 1-2 puffs into the lungs every 4 (four) hours as needed for wheezing or shortness of breath. 1 Inhaler 6  . beclomethasone (QVAR) 40 MCG/ACT inhaler Inhale 1-2 puffs into the lungs 2 (two) times daily. 1 Inhaler 12  . Fluticasone Propionate (FLONASE NA) Place into the nose. Once per nostril. Once daily - Every other day    . levonorgestrel (MIRENA) 20 MCG/24HR IUD 1 each by Intrauterine route once.    Marland Kitchen LYSINE PO Take by mouth every other day.    .  montelukast (SINGULAIR) 10 MG tablet Take 1 tablet (10 mg total) by mouth at bedtime. 90 tablet 3  . Multiple Vitamins-Minerals (MULTIVITAMIN ADULT PO) Take by mouth.    . predniSONE (STERAPRED UNI-PAK 48 TAB) 10 MG (48) TBPK tablet Take by mouth daily. 12-Day taper, po 48 tablet 0  . valACYclovir (VALTREX) 1000 MG tablet Take 1 tablet (1,000 mg total) by mouth 3 (three) times daily. 21 tablet 0   No current facility-administered medications for this visit.    No Known Allergies    Review of Systems:  Constitutional:  No  fever, no chills, No recent illness. No significant fatigue.   HEENT: No  headache, no vision change, no hearing change, No sore throat, No  sinus pressure  Cardiac: No  chest pain, No  pressure, No palpitations,  Respiratory:  No  shortness of breath. No  Cough  Gastrointestinal: No  abdominal pain, No  nausea, No  vomiting  Musculoskeletal: No new myalgia/arthralgia  Skin: No  Rash, No other wounds/concerning lesions  Neurologic: No  weakness, No  dizziness  Psychiatric: No  concerns with depression, No  concerns with anxiety, No sleep problems, No mood problems  Exam:  BP 115/80   Pulse (!) 105   Wt 192 lb (87.1 kg)   BMI 31.95 kg/m   Constitutional: VS see above. General Appearance: alert, well-developed, well-nourished, NAD  Eyes: Normal lids and conjunctive, non-icteric  sclera  Ears, Nose, Mouth, Throat: MMM, Normal external inspection ears/nares/mouth/lips/gums. TM normal bilaterally. Pharynx/tonsils no erythema, no exudate. Nasal mucosa normal.   Neck: No masses, trachea midline. No thyroid enlargement. No tenderness/mass appreciated. No lymphadenopathy  Respiratory: Normal respiratory effort. no wheeze, no rhonchi, no rales  Cardiovascular: S1/S2 normal, no murmur, no rub/gallop auscultated. RRR. No lower extremity edema.   Gastrointestinal: Nontender, no masses. No hepatomegaly, no splenomegaly. No hernia appreciated. Bowel sounds normal.  Rectal exam deferred.   Musculoskeletal: Gait normal. No clubbing/cyanosis of digits.   Neurological: No cranial nerve deficit on limited exam. Motor and sensation intact and symmetric. Cerebellar reflexes intact. Normal balance/coordination. No tremor.   Skin: warm, dry, intact. No rash/ulcer. No concerning nevi or subq nodules on limited exam.    Psychiatric: Normal judgment/insight. Normal mood and affect. Oriented x3.      ASSESSMENT/PLAN:   Annual physical exam - Plan: CBC, COMPLETE METABOLIC PANEL WITH GFR, Lipid panel, TSH, VITAMIN D 25 Hydroxy (Vit-D Deficiency, Fractures)  Need for influenza vaccination - Plan: Flu Vaccine QUAD 36+ mos IM  Asthma, chronic, mild persistent, uncomplicated - Pt declines alternate/deescalation of therapy - Plan: montelukast (SINGULAIR) 10 MG tablet, beclomethasone (QVAR) 40 MCG/ACT inhaler, albuterol (PROVENTIL HFA;VENTOLIN HFA) 108 (90 Base) MCG/ACT inhaler    FEMALE PREVENTIVE CARE Updated 08/11/17   ANNUAL SCREENING/COUNSELING Tobacco - no - quit 13 years ago, maybe a pack a week when she was smoking  Alcohol - social drinker Diet/Exercise - HEALTHY HABITS DISCUSSED TO DECREASE CV RISK - walking gods several times a week, interval training cardio - see printed instructions  Depression - PQH2 Negative Domestic violence concerns - no HTN SCREENING - SEE VITALS Vaccination status - SEE [place] Sexually active in the past year -Yes with female. STI - The patient denies history of sexually transmitted disease. STI testing today? - no  INFECTIOUS DISEASE SCREENING HIV - all adults 15-65 - does not need GC/CT - sexually active - does not need HepC - DOB [birth year] - does not need TB - does not need  DISEASE SCREENING Lipid - needs  DM2 - needs  Osteoporosis - does not need   CANCER SCREENING Cervical - does not need - previous negative Breast - does not need - no known FH  Lung - does not need Colon - does not need -  no known FH  ADULT VACCINATION Influenza - was given Td - was given HPV - was not indicated Zoster - was not indicated Pneumonia - was not indicated  OTHER Fall - exercise and Vit D age 37+ - does not need Consider ASA - age 24-59 - does not need    Visit summary with medication list and pertinent instructions was printed for patient to review. All questions at time of visit were answered - patient instructed to contact office with any additional concerns. ER/RTC precautions were reviewed with the patient. Follow-up plan: Return in about 1 year (around 08/11/2018) for [place], sooner if needed.

## 2017-08-12 LAB — COMPLETE METABOLIC PANEL WITH GFR
AG RATIO: 1.5 (calc) (ref 1.0–2.5)
ALKALINE PHOSPHATASE (APISO): 64 U/L (ref 33–115)
ALT: 16 U/L (ref 6–29)
AST: 19 U/L (ref 10–30)
Albumin: 4.1 g/dL (ref 3.6–5.1)
BILIRUBIN TOTAL: 0.4 mg/dL (ref 0.2–1.2)
BUN: 10 mg/dL (ref 7–25)
CALCIUM: 9.4 mg/dL (ref 8.6–10.2)
CO2: 29 mmol/L (ref 20–32)
Chloride: 104 mmol/L (ref 98–110)
Creat: 0.88 mg/dL (ref 0.50–1.10)
GFR, EST NON AFRICAN AMERICAN: 82 mL/min/{1.73_m2} (ref >=60)
GFR, Est African American: 95 mL/min/{1.73_m2} (ref >=60)
GLOBULIN: 2.7 g/dL (ref 1.9–3.7)
Glucose, Bld: 90 mg/dL (ref 65–99)
POTASSIUM: 4 mmol/L (ref 3.5–5.3)
SODIUM: 139 mmol/L (ref 135–146)
Total Protein: 6.8 g/dL (ref 6.1–8.1)

## 2017-08-12 LAB — VITAMIN D 25 HYDROXY (VIT D DEFICIENCY, FRACTURES): VIT D 25 HYDROXY: 35 ng/mL (ref 30–100)

## 2017-08-12 LAB — CBC
HEMATOCRIT: 40.3 % (ref 35.0–45.0)
Hemoglobin: 13.9 g/dL (ref 11.7–15.5)
MCH: 29.9 pg (ref 27.0–33.0)
MCHC: 34.5 g/dL (ref 32.0–36.0)
MCV: 86.7 fL (ref 80.0–100.0)
MPV: 10.4 fL (ref 7.5–12.5)
Platelets: 334 10*3/uL (ref 140–400)
RBC: 4.65 10*6/uL (ref 3.80–5.10)
RDW: 12 % (ref 11.0–15.0)
WBC: 11.7 10*3/uL — ABNORMAL HIGH (ref 3.8–10.8)

## 2017-08-12 LAB — LIPID PANEL
Cholesterol: 183 mg/dL (ref <200)
HDL: 40 mg/dL — AB (ref >50)
LDL Cholesterol (Calc): 112 mg/dL (calc) — ABNORMAL HIGH
Non-HDL Cholesterol (Calc): 143 mg/dL (calc) — ABNORMAL HIGH (ref <130)
Total CHOL/HDL Ratio: 4.6 (calc) (ref <5.0)
Triglycerides: 193 mg/dL — ABNORMAL HIGH (ref <150)

## 2017-08-12 LAB — TSH: TSH: 1.51 mIU/L

## 2017-08-13 NOTE — Addendum Note (Signed)
Addended by: Maryla Morrow on: 08/13/2017 12:32 PM   Modules accepted: Orders

## 2017-12-07 DIAGNOSIS — E7800 Pure hypercholesterolemia, unspecified: Secondary | ICD-10-CM | POA: Insufficient documentation

## 2017-12-07 DIAGNOSIS — E782 Mixed hyperlipidemia: Secondary | ICD-10-CM | POA: Insufficient documentation

## 2018-02-09 ENCOUNTER — Ambulatory Visit: Payer: 59

## 2018-06-22 ENCOUNTER — Encounter: Payer: Self-pay | Admitting: Sports Medicine

## 2018-06-22 ENCOUNTER — Ambulatory Visit: Payer: 59 | Admitting: Sports Medicine

## 2018-06-22 DIAGNOSIS — J4530 Mild persistent asthma, uncomplicated: Secondary | ICD-10-CM

## 2018-06-22 DIAGNOSIS — Z8669 Personal history of other diseases of the nervous system and sense organs: Secondary | ICD-10-CM

## 2018-06-22 MED ORDER — ALBUTEROL SULFATE HFA 108 (90 BASE) MCG/ACT IN AERS: 1.0000 | INHALATION_SPRAY | RESPIRATORY_TRACT | 6 refills | Status: DC | PRN

## 2018-06-22 MED ORDER — BECLOMETHASONE DIPROPIONATE 40 MCG/ACT IN AERS: 1.0000 | INHALATION_SPRAY | Freq: Two times a day (BID) | RESPIRATORY_TRACT | 12 refills | Status: DC

## 2018-06-22 MED ORDER — MONTELUKAST SODIUM 10 MG PO TABS: 10.0000 mg | ORAL_TABLET | Freq: Every day | ORAL | 3 refills | Status: DC

## 2018-06-22 MED ORDER — RIZATRIPTAN BENZOATE 10 MG PO TBDP: 10.0000 mg | ORAL_TABLET | ORAL | 3 refills | Status: DC | PRN

## 2018-06-22 MED ORDER — TOPIRAMATE 50 MG PO TABS: ORAL_TABLET | ORAL | 3 refills | Status: DC

## 2018-06-22 NOTE — Progress Notes (Signed)
Subjective:    CC: Migraine headaches  HPI: This is a very pleasant 42 year old female, she has a history of migraine headaches, previously managed with neurology.  She was on Topamax and Imitrex.  She has been off of these medications and has has had multiple migraine days per month.  More bad days than good days.  Agreeable to go back on preventative and abortive treatment.  No focal neurologic symptoms.  No trauma, no constitutional symptoms.  Symptoms are severe, persistent.  I reviewed the past medical history, family history, social history, surgical history, and allergies today and no changes were needed.  Please see the problem list section below in epic for further details.  Past Medical History: Past Medical History:  Diagnosis Date  . Allergy   . Asthma   . Migraine headache    Past Surgical History: Past Surgical History:  Procedure Laterality Date  . WISDOM TOOTH EXTRACTION     Social History: Social History   Socioeconomic History  . Marital status: Married    Spouse name: Not on file  . Number of children: Not on file  . Years of education: Not on file  . Highest education level: Not on file  Occupational History  . Occupation: office administration  Social Needs  . Financial resource strain: Not on file  . Food insecurity:    Worry: Not on file    Inability: Not on file  . Transportation needs:    Medical: Not on file    Non-medical: Not on file  Tobacco Use  . Smoking status: Former Smoker    Last attempt to quit: 10/20/1999    Years since quitting: 18.6  . Smokeless tobacco: Never Used  Substance and Sexual Activity  . Alcohol use: Yes    Alcohol/week: 0.0 standard drinks    Comment: 1/week  . Drug use: No    Frequency: 1.0 times per week  . Sexual activity: Yes    Partners: Male    Birth control/protection: IUD  Lifestyle  . Physical activity:    Days per week: Not on file    Minutes per session: Not on file  . Stress: Not on file    Relationships  . Social connections:    Talks on phone: Not on file    Gets together: Not on file    Attends religious service: Not on file    Active member of club or organization: Not on file    Attends meetings of clubs or organizations: Not on file    Relationship status: Not on file  Other Topics Concern  . Not on file  Social History Narrative  . Not on file   Family History: Family History  Adopted: Yes  Family history unknown: Yes   Allergies: No Known Allergies Medications: See med rec.  Review of Systems: No fevers, chills, night sweats, weight loss, chest pain, or shortness of breath.   Objective:    General: Well Developed, well nourished, and in no acute distress.  Neuro: Alert and oriented x3, extra-ocular muscles intact, sensation grossly intact.  Cranial nerves II through XII intact, motor, sensory, coordinative functions are all intact. HEENT: Normocephalic, atraumatic, pupils equal round reactive to light, neck supple, no masses, no lymphadenopathy, thyroid nonpalpable.  Skin: Warm and dry, no rashes. Cardiac: Regular rate and rhythm, no murmurs rubs or gallops, no lower extremity edema.  Respiratory: Clear to auscultation bilaterally. Not using accessory muscles, speaking in full sentences.  Impression and Recommendations:    History  of migraine Currently uncontrolled, adding standard release topiramate in an up taper. Maxalt for migraine abortive treatment. Keep a migraine diary and return in a month. ___________________________________________ Gwen Her. Dianah Field, M.D., ABFM., CAQSM. Primary Care and [place] Instructor of [place] of [facility]

## 2018-06-22 NOTE — Assessment & Plan Note (Signed)
Currently uncontrolled, adding standard release topiramate in an up taper. Maxalt for migraine abortive treatment. Keep a migraine diary and return in a month.

## 2018-06-28 ENCOUNTER — Ambulatory Visit: Payer: 59 | Admitting: Osteopathic Medicine

## 2018-07-20 ENCOUNTER — Ambulatory Visit: Payer: 59 | Admitting: Sports Medicine

## 2018-07-20 ENCOUNTER — Encounter: Payer: Self-pay | Admitting: Sports Medicine

## 2018-07-20 DIAGNOSIS — Z8669 Personal history of other diseases of the nervous system and sense organs: Secondary | ICD-10-CM | POA: Diagnosis not present

## 2018-07-20 MED ORDER — TOPIRAMATE 50 MG PO TABS: 75.0000 mg | ORAL_TABLET | Freq: Two times a day (BID) | ORAL | 3 refills | Status: DC

## 2018-07-20 MED ORDER — RIZATRIPTAN BENZOATE 10 MG PO TBDP: 10.0000 mg | ORAL_TABLET | ORAL | 3 refills | Status: DC | PRN

## 2018-07-20 NOTE — Assessment & Plan Note (Signed)
Historically uncontrolled, but now well controlled. Patient does desire to go up to 1.5 Topamax pills twice a day, refilling Maxalt. She is very happy with how things are going.  Very few migraines now.

## 2018-07-20 NOTE — Progress Notes (Signed)
Subjective:    CC: Recheck migraines  HPI: Candice Hanson returns, we started Topamax at the last visit, she is currently doing 50 mg twice daily, and her migraines are very well controlled now.  She would like to go up to 75 twice a day, this was her prior dose and she felt well controlled.  I reviewed the past medical history, family history, social history, surgical history, and allergies today and no changes were needed.  Please see the problem list section below in epic for further details.  Past Medical History: Past Medical History:  Diagnosis Date  . Allergy   . Asthma   . Migraine headache    Past Surgical History: Past Surgical History:  Procedure Laterality Date  . WISDOM TOOTH EXTRACTION     Social History: Social History   Socioeconomic History  . Marital status: Married    Spouse name: Not on file  . Number of children: Not on file  . Years of education: Not on file  . Highest education level: Not on file  Occupational History  . Occupation: office administration  Social Needs  . Financial resource strain: Not on file  . Food insecurity:    Worry: Not on file    Inability: Not on file  . Transportation needs:    Medical: Not on file    Non-medical: Not on file  Tobacco Use  . Smoking status: Former Smoker    Last attempt to quit: 10/20/1999    Years since quitting: 18.7  . Smokeless tobacco: Never Used  Substance and Sexual Activity  . Alcohol use: Yes    Alcohol/week: 0.0 standard drinks    Comment: 1/week  . Drug use: No    Frequency: 1.0 times per week  . Sexual activity: Yes    Partners: Male    Birth control/protection: IUD  Lifestyle  . Physical activity:    Days per week: Not on file    Minutes per session: Not on file  . Stress: Not on file  Relationships  . Social connections:    Talks on phone: Not on file    Gets together: Not on file    Attends religious service: Not on file    Active member of club or organization: Not on file   Attends meetings of clubs or organizations: Not on file    Relationship status: Not on file  Other Topics Concern  . Not on file  Social History Narrative  . Not on file   Family History: Family History  Adopted: Yes  Family history unknown: Yes   Allergies: No Known Allergies Medications: See med rec.  Review of Systems: No fevers, chills, night sweats, weight loss, chest pain, or shortness of breath.   Objective:    General: Well Developed, well nourished, and in no acute distress.  Neuro: Alert and oriented x3, extra-ocular muscles intact, sensation grossly intact.  HEENT: Normocephalic, atraumatic, pupils equal round reactive to light, neck supple, no masses, no lymphadenopathy, thyroid nonpalpable.  Skin: Warm and dry, no rashes. Cardiac: Regular rate and rhythm, no murmurs rubs or gallops, no lower extremity edema.  Respiratory: Clear to auscultation bilaterally. Not using accessory muscles, speaking in full sentences.  Impression and Recommendations:    History of migraine Historically uncontrolled, but now well controlled. Patient does desire to go up to 1.5 Topamax pills twice a day, refilling Maxalt. She is very happy with how things are going.  Very few migraines now. ___________________________________________ Gwen Her. Dianah Field, M.D., ABFM., CAQSM.  Primary Care and [place] Instructor of [place] of [facility]

## 2018-08-28 ENCOUNTER — Other Ambulatory Visit: Payer: Self-pay | Admitting: Osteopathic Medicine

## 2018-08-28 DIAGNOSIS — J4530 Mild persistent asthma, uncomplicated: Secondary | ICD-10-CM

## 2018-09-13 ENCOUNTER — Encounter: Payer: Self-pay | Admitting: Osteopathic Medicine

## 2018-09-13 ENCOUNTER — Ambulatory Visit: Payer: 59 | Admitting: Osteopathic Medicine

## 2018-09-13 VITALS — BP 124/52 | HR 80 | Temp 98.2°F | Wt 180.0 lb

## 2018-09-13 DIAGNOSIS — Z8669 Personal history of other diseases of the nervous system and sense organs: Secondary | ICD-10-CM | POA: Diagnosis not present

## 2018-09-13 DIAGNOSIS — J4530 Mild persistent asthma, uncomplicated: Secondary | ICD-10-CM

## 2018-09-13 DIAGNOSIS — Z0000 Encounter for general adult medical examination without abnormal findings: Secondary | ICD-10-CM | POA: Diagnosis not present

## 2018-09-13 MED ORDER — BECLOMETHASONE DIPROPIONATE 40 MCG/ACT IN AERS: 1.0000 | INHALATION_SPRAY | Freq: Two times a day (BID) | RESPIRATORY_TRACT | 12 refills | Status: DC

## 2018-09-13 MED ORDER — MONTELUKAST SODIUM 10 MG PO TABS: 10.0000 mg | ORAL_TABLET | Freq: Every day | ORAL | 3 refills | Status: DC

## 2018-09-13 MED ORDER — ALBUTEROL SULFATE HFA 108 (90 BASE) MCG/ACT IN AERS: 1.0000 | INHALATION_SPRAY | RESPIRATORY_TRACT | 6 refills | Status: DC | PRN

## 2018-09-13 NOTE — Progress Notes (Signed)
HPI: Candice Hanson is a 42 y.o. female who  has a past medical history of Allergy, Asthma, and Migraine headache.  she presents to [facility] today, 09/13/18,  for chief complaint of: Annual physical    Patient here for annual physical / wellness exam.  See preventive care reviewed as below.   Additional concerns today include:  None - doing well on migraine meds, Dr T restarted topamax and this has helped a lot       Past medical, surgical, social and family history reviewed:  Patient Active Problem List   Diagnosis Date Noted  . Elevated LDL cholesterol level 12/07/2017  . Elevated cholesterol with high triglycerides 12/07/2017  . Annual physical exam 06/10/2016  . Intrauterine contraceptive device status 06/06/2015  . History of migraine 06/06/2015  . Irregular uterine bleeding 06/06/2015  . Asthma, chronic 06/06/2015  . Lipid screening 06/06/2015    Past Surgical History:  Procedure Laterality Date  . WISDOM TOOTH EXTRACTION      Social History   Tobacco Use  . Smoking status: Former Smoker    Last attempt to quit: 10/20/1999    Years since quitting: 18.9  . Smokeless tobacco: Never Used  Substance Use Topics  . Alcohol use: Yes    Alcohol/week: 0.0 standard drinks    Comment: 1/week    Family History  Adopted: Yes  Family history unknown: Yes     Current medication list and allergy/intolerance information reviewed:    Current Outpatient Medications  Medication Sig Dispense Refill  . albuterol (PROVENTIL HFA;VENTOLIN HFA) 108 (90 Base) MCG/ACT inhaler Inhale 1-2 puffs into the lungs every 4 (four) hours as needed for wheezing or shortness of breath. 1 Inhaler 6  . beclomethasone (QVAR) 40 MCG/ACT inhaler Inhale 1-2 puffs into the lungs 2 (two) times daily. 1 Inhaler 12  . Fluticasone Propionate (FLONASE NA) Place into the nose. Once per nostril. Once daily - Every other day    . levonorgestrel (MIRENA) 20 MCG/24HR  IUD 1 each by Intrauterine route once.    Marland Kitchen LYSINE PO Take by mouth every other day.    . montelukast (SINGULAIR) 10 MG tablet Take 1 tablet (10 mg total) by mouth at bedtime. 90 tablet 3  . Multiple Vitamins-Minerals (MULTIVITAMIN ADULT PO) Take by mouth.    . rizatriptan (MAXALT-MLT) 10 MG disintegrating tablet Take 1 tablet (10 mg total) by mouth as needed for migraine. May repeat in 2 hours if needed 10 tablet 3  . topiramate (TOPAMAX) 50 MG tablet Take 1.5 tablets (75 mg total) by mouth 2 (two) times daily. 270 tablet 3   No current facility-administered medications for this visit.     No Known Allergies    Review of Systems:  Constitutional:  No  fever, no chills, No recent illness, No unintentional weight changes. No significant fatigue.   HEENT: No  headache, no vision change, no hearing change, No sore throat, No  sinus pressure  Cardiac: No  chest pain, No  pressure, No palpitations, No  Orthopnea  Respiratory:  No  shortness of breath. No  Cough  Gastrointestinal: No  abdominal pain, No  nausea, No  vomiting,  No  blood in stool, No  diarrhea, No  constipation   Musculoskeletal: No new myalgia/arthralgia  Skin: No  Rash, No other wounds/concerning lesions  Genitourinary: No  incontinence, No  abnormal genital bleeding, No abnormal genital discharge  Hem/Onc: No  easy bruising/bleeding, No  abnormal lymph node  Endocrine:  No cold intolerance,  No heat intolerance. No polyuria/polydipsia/polyphagia   Neurologic: No  weakness, No  dizziness, No  slurred speech/focal weakness/facial droop  Psychiatric: No  concerns with depression, No  concerns with anxiety, No sleep problems, No mood problems  Exam:  BP (!) 124/52 (BP Location: Left Arm, Patient Position: Sitting, Cuff Size: Normal)   Pulse 80   Temp 98.2 F (36.8 C) (Oral)   Wt 180 lb (81.6 kg)   BMI 29.95 kg/m   Constitutional: VS see above. General Appearance: alert, well-developed, well-nourished,  NAD  Eyes: Normal lids and conjunctive, non-icteric sclera  Ears, Nose, Mouth, Throat: MMM, Normal external inspection ears/nares/mouth/lips/gums. TM normal bilaterally. Pharynx/tonsils no erythema, no exudate. Nasal mucosa normal.   Neck: No masses, trachea midline. No thyroid enlargement. No tenderness/mass appreciated. No lymphadenopathy  Respiratory: Normal respiratory effort. no wheeze, no rhonchi, no rales  Cardiovascular: S1/S2 normal, no murmur, no rub/gallop auscultated. RRR. No lower extremity edema.   Gastrointestinal: Nontender, no masses. No hepatomegaly, no splenomegaly. No hernia appreciated. Bowel sounds normal. Rectal exam deferred.   Musculoskeletal: Gait normal. No clubbing/cyanosis of digits.   Neurological: Normal balance/coordination. No tremor. No cranial nerve deficit on limited exam. Motor and sensation intact and symmetric. Cerebellar reflexes intact.   Skin: warm, dry, intact. No rash/ulcer. No concerning nevi or subq nodules on limited exam.    Psychiatric: Normal judgment/insight. Normal mood and affect. Oriented x3.      ASSESSMENT/PLAN: The primary encounter diagnosis was Annual physical exam. Diagnoses of Asthma, chronic, mild persistent, uncomplicated and History of migraine were also pertinent to this visit.   Refilled meds - ok to conitnue refills x1 year  Orders Placed This Encounter  Procedures  . MM 3D SCREEN BREAST BILATERAL  . CBC  . COMPLETE METABOLIC PANEL WITH GFR  . Lipid panel    Meds ordered this encounter  Medications  . albuterol (PROVENTIL HFA;VENTOLIN HFA) 108 (90 Base) MCG/ACT inhaler    Sig: Inhale 1-2 puffs into the lungs every 4 (four) hours as needed for wheezing or shortness of breath.    Dispense:  1 Inhaler    Refill:  6  . beclomethasone (QVAR) 40 MCG/ACT inhaler    Sig: Inhale 1-2 puffs into the lungs 2 (two) times daily.    Dispense:  1 Inhaler    Refill:  12  . montelukast (SINGULAIR) 10 MG tablet    Sig:  Take 1 tablet (10 mg total) by mouth at bedtime.    Dispense:  90 tablet    Refill:  3    Patient Instructions  General Preventive Care  Most recent routine screening lipids/other labs: ordered today. Cholesterol and Diabetes screening usually recommended annually.   Everyone should have blood pressure checked once per year.   Tobacco: don't! Alcohol: responsible moderation is ok for most adults - if you have concerns about your alcohol intake, please talk to me! Recreational/Illicit Drugs: don't!  Exercise: as tolerated to reduce risk of cardiovascular disease and diabetes. Strength training will also prevent osteoporosis.   Mental health: if need for mental health care (medicines, counseling, other), or concerns about moods, please let me know!   Sexual health: if need for STD testing, or if concerns with libido/pain problems, please let me know! If you need to discuss your birth control options, please let me know!  Vaccines  Flu vaccine: recommended for almost everyone, every fall (by Halloween! Flu is scary!), especially if you have exposure to the public, if you're around  young children or the elderly, or if you're around pregnant people.   Shingles vaccine: Shingrix recommended after age 55   Pneumonia vaccine: done once for you 07/2012, so you're set until age 67  Tetanus booster: Tdap recommended every 10 years, due 05/2026 Cancer screenings   Colon cancer screening: recommended for everyone at age 71, we may consider it at 75 for you since we don't know family history  Breast cancer screening: mammogram recommended at age 16 every other year at least, and annually after age 13.   Cervical cancer screening: Pap due 06/2020  Lung cancer screening: not needed  Infection screenings . HIV: recommended screening at least once age 9-65, more often if risk factors  . Gonorrhea/Chlamydia: screening as needed, though many insurances require testing for anyone on birth control  pills . Hepatitis C: recommended for anyone born 12-1962 . TB: certain at-risk populations, or depending on work requirements and/or travel history Other . Bone Density Test: recommended for women at age 69, sooner depending on risk factors . Advanced Directive: Living Will and/or Healthcare Power of Attorney recommended for all adults, regardless of age or health! . Thyroid and Vitamin D: routine screening is not medically necessary, therefore most insurance will not cover this test as part of "free labs" on your annual physical. If you desire this testing, you may be charged for it!      Immunization History  Administered Date(s) Administered  . Influenza, Seasonal, Injecte, Preservative Fre 10/02/2013  . Influenza,inj,Quad PF,6+ Mos 06/10/2016, 08/11/2017  . Influenza-Unspecified 10/01/2011, 07/21/2012, 08/18/2012, 10/02/2013  . Pneumococcal Polysaccharide-23 08/18/2012  . Tdap 06/10/2016      Visit summary with medication list and pertinent instructions was printed for patient to review. All questions at time of visit were answered - patient instructed to contact office with any additional concerns or updates. ER/RTC precautions were reviewed with the patient.     Please note: voice recognition software was used to produce this document, and typos may escape review. Please contact Dr. Sheppard Coil for any needed clarifications.     Follow-up plan: Return in about 1 year (around 09/14/2019) for [place] - sooner if needed .

## 2018-09-13 NOTE — Patient Instructions (Addendum)
General Preventive Care  Most recent routine screening lipids/other labs: ordered today. Cholesterol and Diabetes screening usually recommended annually.   Everyone should have blood pressure checked once per year.   Tobacco: don't! Alcohol: responsible moderation is ok for most adults - if you have concerns about your alcohol intake, please talk to me! Recreational/Illicit Drugs: don't!  Exercise: as tolerated to reduce risk of cardiovascular disease and diabetes. Strength training will also prevent osteoporosis.   Mental health: if need for mental health care (medicines, counseling, other), or concerns about moods, please let me know!   Sexual health: if need for STD testing, or if concerns with libido/pain problems, please let me know! If you need to discuss your birth control options, please let me know!  Vaccines  Flu vaccine: recommended for almost everyone, every fall (by Halloween! Flu is scary!), especially if you have exposure to the public, if you're around young children or the elderly, or if you're around pregnant people.   Shingles vaccine: Shingrix recommended after age 46   Pneumonia vaccine: done once for you 07/2012, so you're set until age 46  Tetanus booster: Tdap recommended every 10 years, due 05/2026 Cancer screenings   Colon cancer screening: recommended for everyone at age 41, we may consider it at 79 for you since we don't know family history  Breast cancer screening: mammogram recommended at age 74 every other year at least, and annually after age 33.   Cervical cancer screening: Pap due 06/2020  Lung cancer screening: not needed  Infection screenings . HIV: recommended screening at least once age 4-65, more often if risk factors  . Gonorrhea/Chlamydia: screening as needed, though many insurances require testing for anyone on birth control pills . Hepatitis C: recommended for anyone born 85-1964 . TB: certain at-risk populations, or depending on work  requirements and/or travel history Other . Bone Density Test: recommended for women at age 31, sooner depending on risk factors . Advanced Directive: Living Will and/or Healthcare Power of Attorney recommended for all adults, regardless of age or health! . Thyroid and Vitamin D: routine screening is not medically necessary, therefore most insurance will not cover this test as part of "free labs" on your annual physical. If you desire this testing, you may be charged for it!

## 2018-09-14 ENCOUNTER — Other Ambulatory Visit: Payer: Self-pay | Admitting: Sports Medicine

## 2018-09-14 DIAGNOSIS — Z8669 Personal history of other diseases of the nervous system and sense organs: Secondary | ICD-10-CM

## 2018-09-23 ENCOUNTER — Other Ambulatory Visit: Payer: Self-pay | Admitting: Osteopathic Medicine

## 2018-09-23 DIAGNOSIS — J4530 Mild persistent asthma, uncomplicated: Secondary | ICD-10-CM

## 2018-11-09 ENCOUNTER — Encounter: Payer: 59 | Admitting: Osteopathic Medicine

## 2019-02-01 ENCOUNTER — Telehealth: Payer: Self-pay | Admitting: Family Medicine

## 2019-02-01 NOTE — Telephone Encounter (Signed)
Patient called and she reports the pharmacy was trying to fill the wrong prescription for her and I called the pharmacy and they had 2 prescriptions on file for this patient. They cancelled out the half tablet in am and half tablet at night. The correct one is in file at [facility]. Patient was notified and did not have any additional questions.

## 2019-07-28 ENCOUNTER — Other Ambulatory Visit: Payer: Self-pay

## 2019-07-28 DIAGNOSIS — Z8669 Personal history of other diseases of the nervous system and sense organs: Secondary | ICD-10-CM

## 2019-07-28 MED ORDER — TOPIRAMATE 50 MG PO TABS: 75.0000 mg | ORAL_TABLET | Freq: Two times a day (BID) | ORAL | 1 refills | Status: DC

## 2019-07-28 NOTE — Telephone Encounter (Signed)
Guillermo needs a refill to last until her November appointment.

## 2019-08-07 ENCOUNTER — Other Ambulatory Visit: Payer: Self-pay | Admitting: Sports Medicine

## 2019-08-07 DIAGNOSIS — Z8669 Personal history of other diseases of the nervous system and sense organs: Secondary | ICD-10-CM

## 2019-08-07 NOTE — Telephone Encounter (Signed)
To PCP

## 2019-08-17 ENCOUNTER — Other Ambulatory Visit: Payer: Self-pay | Admitting: Osteopathic Medicine

## 2019-08-17 DIAGNOSIS — Z8669 Personal history of other diseases of the nervous system and sense organs: Secondary | ICD-10-CM

## 2019-08-17 NOTE — Telephone Encounter (Signed)
Requested medication (s) are due for refill today: yes  Requested medication (s) are on the active medication list: yes  Last refill:  08/07/2019  Future visit scheduled: no  Notes to clinic:  Refill cannot be delegated    Requested Prescriptions  Pending Prescriptions Disp Refills   topiramate (TOPAMAX) 50 MG tablet [Pharmacy Med Name: TOPIRAMATE 50 MG TABLET] 45 tablet 1    Sig: TAKE 1 AND 1/2 TABLETS BY MOUTH 2 TIMES A DAY     Not Delegated - Neurology: Anticonvulsants - topiramate & zonisamide Failed - 08/17/2019  9:31 AM      Failed - This refill cannot be delegated      Failed - Cr in normal range and within 360 days    Creat  Date Value Ref Range Status  08/11/2017 0.88 0.50 - 1.10 mg/dL Final         Failed - CO2 in normal range and within 360 days    CO2  Date Value Ref Range Status  08/11/2017 29 20 - 32 mmol/L Final         Failed - Valid encounter within last 12 months    Recent Outpatient Visits          11 months ago Annual physical exam   [facility], Lanelle Bal, DO   1 year ago History of migraine   [facility], Gwen Her, MD   1 year ago Asthma, chronic, mild persistent, uncomplicated    [facility], Gwen Her, MD   2 years ago Annual physical exam   [place], Lanelle Bal, DO   2 years ago Sprain of left rotator cuff capsule, initial encounter   [place], [place], DO

## 2019-09-03 ENCOUNTER — Other Ambulatory Visit: Payer: Self-pay | Admitting: Osteopathic Medicine

## 2019-09-03 DIAGNOSIS — Z8669 Personal history of other diseases of the nervous system and sense organs: Secondary | ICD-10-CM

## 2019-09-04 NOTE — Telephone Encounter (Signed)
Needs appointment

## 2019-09-11 ENCOUNTER — Telehealth: Payer: Self-pay | Admitting: Osteopathic Medicine

## 2019-09-11 DIAGNOSIS — E7800 Pure hypercholesterolemia, unspecified: Secondary | ICD-10-CM

## 2019-09-11 DIAGNOSIS — Z0000 Encounter for general adult medical examination without abnormal findings: Secondary | ICD-10-CM

## 2019-09-11 DIAGNOSIS — E782 Mixed hyperlipidemia: Secondary | ICD-10-CM

## 2019-09-11 NOTE — Telephone Encounter (Signed)
Labwork has been ordered.

## 2019-09-11 NOTE — Telephone Encounter (Signed)
Patient was advised.  

## 2019-09-11 NOTE — Telephone Encounter (Signed)
Needs lab work for virtual physical

## 2019-09-13 ENCOUNTER — Ambulatory Visit (INDEPENDENT_AMBULATORY_CARE_PROVIDER_SITE_OTHER): Payer: Managed Care, Other (non HMO) | Admitting: Osteopathic Medicine

## 2019-09-13 ENCOUNTER — Encounter: Payer: Self-pay | Admitting: Osteopathic Medicine

## 2019-09-13 ENCOUNTER — Other Ambulatory Visit: Payer: Self-pay

## 2019-09-13 VITALS — BP 114/75 | HR 88 | Temp 98.1°F | Wt 164.1 lb

## 2019-09-13 DIAGNOSIS — Z0000 Encounter for general adult medical examination without abnormal findings: Secondary | ICD-10-CM | POA: Diagnosis not present

## 2019-09-13 DIAGNOSIS — J4520 Mild intermittent asthma, uncomplicated: Secondary | ICD-10-CM

## 2019-09-13 DIAGNOSIS — Z8669 Personal history of other diseases of the nervous system and sense organs: Secondary | ICD-10-CM | POA: Diagnosis not present

## 2019-09-13 DIAGNOSIS — J4530 Mild persistent asthma, uncomplicated: Secondary | ICD-10-CM

## 2019-09-13 DIAGNOSIS — Z23 Encounter for immunization: Secondary | ICD-10-CM | POA: Diagnosis not present

## 2019-09-13 DIAGNOSIS — E782 Mixed hyperlipidemia: Secondary | ICD-10-CM

## 2019-09-13 DIAGNOSIS — Z1231 Encounter for screening mammogram for malignant neoplasm of breast: Secondary | ICD-10-CM

## 2019-09-13 MED ORDER — TOPIRAMATE 50 MG PO TABS: 75.0000 mg | ORAL_TABLET | Freq: Two times a day (BID) | ORAL | 3 refills | Status: DC

## 2019-09-13 MED ORDER — ALBUTEROL SULFATE HFA 108 (90 BASE) MCG/ACT IN AERS: 1.0000 | INHALATION_SPRAY | RESPIRATORY_TRACT | 11 refills | Status: DC | PRN

## 2019-09-13 MED ORDER — QVAR 40 MCG/ACT IN AERS: 2.0000 | INHALATION_SPRAY | Freq: Two times a day (BID) | RESPIRATORY_TRACT | 12 refills | Status: DC

## 2019-09-13 MED ORDER — MONTELUKAST SODIUM 10 MG PO TABS: 10.0000 mg | ORAL_TABLET | Freq: Every day | ORAL | 3 refills | Status: DC

## 2019-09-13 MED ORDER — RIZATRIPTAN BENZOATE 10 MG PO TBDP: 10.0000 mg | ORAL_TABLET | ORAL | 3 refills | Status: DC | PRN

## 2019-09-13 MED ORDER — FLUTICASONE PROPIONATE 50 MCG/ACT NA SUSP: 1.0000 | NASAL | 3 refills | Status: DC

## 2019-09-13 NOTE — Patient Instructions (Addendum)
General Preventive Care  Most recent routine screening lipids/other labs: orders are in!   Everyone should have blood pressure checked once per year.   Tobacco: don't!   Alcohol: responsible moderation is ok for most adults - if you have concerns about your alcohol intake, please talk to me!   Exercise: as tolerated to reduce risk of cardiovascular disease and diabetes. Strength training will also prevent osteoporosis.   Mental health: if need for mental health care (medicines, counseling, other), or concerns about moods, please let me know!   Sexual health: if need for STD testing, or if concerns with libido/pain problems, please let me know! If you need to discuss your birth control options, please let me know!   Advanced Directive: Living Will and/or Healthcare Power of Attorney recommended for all adults, regardless of age or health.  Vaccines  Flu vaccine: recommended for almost everyone, every fall.   Shingles vaccine: Shingrix recommended after age 60.   Pneumonia vaccines: recommended after age 44  Tetanus booster: Tdap recommended every 10 years. Due 2027.  Cancer screenings   Colon cancer screening: recommended for everyone starting at age 71-50  Breast cancer screening: mammogram recommended at age 14 every other year at least, and annually after age 67.   Cervical cancer screening: Pap due 06/2020  Lung cancer screening: not needed as long as you don't take up smoking again!  Infection screenings . HIV: recommended screening at least once age 61-65 . Gonorrhea/Chlamydia: screening as needed . Hepatitis C: recommended once for anyone born 54-1964 . TB: certain at-risk populations, or depending on work requirements and/or travel history Other . Bone Density Test: recommended for women at age 52

## 2019-09-13 NOTE — Progress Notes (Signed)
HPI: Candice Hanson is a 43 y.o. female who  has a past medical history of Allergy, Asthma, and Migraine headache.  she presents to [facility] today, 09/13/19,  for chief complaint of: Annual physical and routine refills    Patient here for annual physical / wellness exam.  See preventive care reviewed as below.   Additional concerns today include:   Nothing major!  She is working from home through the pandemic and doing pretty well.  Her husband got sick with Covid earlier after an outbreak at work, she briefly felt ill as well, did not get tested, but assumes she was also sick with Covid.  Thankfully, relatively brief/minor course of illness and patient feels back to normal now.  She is still being diligent about mask use, distancing, isolation.  Current migraine medications are working well for prevention and treatment as needed.  Mirena IUD placed 08/2015    Past medical, surgical, social and family history reviewed:  Patient Active Problem List   Diagnosis Date Noted  . Elevated LDL cholesterol level 12/07/2017  . Elevated cholesterol with high triglycerides 12/07/2017  . Annual physical exam 06/10/2016  . Intrauterine contraceptive device status 06/06/2015  . History of migraine 06/06/2015  . Irregular uterine bleeding 06/06/2015  . Asthma, chronic 06/06/2015  . Lipid screening 06/06/2015    Past Surgical History:  Procedure Laterality Date  . WISDOM TOOTH EXTRACTION      Social History   Tobacco Use  . Smoking status: Former Smoker    Quit date: 10/20/1999    Years since quitting: 19.9  . Smokeless tobacco: Never Used  Substance Use Topics  . Alcohol use: Yes    Alcohol/week: 0.0 standard drinks    Comment: 1/week    Family History  Adopted: Yes  Family history unknown: Yes     Current medication list and allergy/intolerance information reviewed:    Current Outpatient Medications  Medication Sig Dispense Refill  .  levonorgestrel (MIRENA) 20 MCG/24HR IUD 1 each by Intrauterine route once.    Marland Kitchen LYSINE PO Take by mouth every other day.    . Multiple Vitamins-Minerals (MULTIVITAMIN ADULT PO) Take by mouth.    Marland Kitchen albuterol (VENTOLIN HFA) 108 (90 Base) MCG/ACT inhaler Inhale 1-2 puffs into the lungs every 4 (four) hours as needed for wheezing or shortness of breath. 18 g 11  . beclomethasone (QVAR) 40 MCG/ACT inhaler Inhale 2 puffs into the lungs 2 (two) times daily. 3 Inhaler 12  . fluticasone (FLONASE) 50 MCG/ACT nasal spray Place 1-2 sprays into both nostrils every other day. 48 g 3  . montelukast (SINGULAIR) 10 MG tablet Take 1 tablet (10 mg total) by mouth at bedtime. 90 tablet 3  . rizatriptan (MAXALT-MLT) 10 MG disintegrating tablet Take 1 tablet (10 mg total) by mouth as needed for migraine. May repeat in 2 hours if needed 10 tablet 3  . topiramate (TOPAMAX) 50 MG tablet Take 1.5 tablets (75 mg total) by mouth 2 (two) times daily. 270 tablet 3   No current facility-administered medications for this visit.     No Known Allergies    Review of Systems:  Constitutional:  No  fever, no chills, No recent illness, No unintentional weight changes. No significant fatigue.   HEENT: No  headache, no vision change, no hearing change, No sore throat, No  sinus pressure  Cardiac: No  chest pain, No  pressure, No palpitations, No  Orthopnea  Respiratory:  No  shortness of breath.  No  Cough  Gastrointestinal: No  abdominal pain, No  nausea, No  vomiting,  No  blood in stool, No  diarrhea, No  constipation   Musculoskeletal: No new myalgia/arthralgia  Skin: No  Rash, No other wounds/concerning lesions  Genitourinary: No  incontinence, No  abnormal genital bleeding, No abnormal genital discharge  Hem/Onc: No  easy bruising/bleeding, No  abnormal lymph node  Endocrine: No cold intolerance,  No heat intolerance. No polyuria/polydipsia/polyphagia   Neurologic: No  weakness, No  dizziness, No  slurred  speech/focal weakness/facial droop  Psychiatric: No  concerns with depression, No  concerns with anxiety, No sleep problems, No mood problems  Exam:  BP 114/75 (BP Location: Right Arm, Patient Position: Sitting, Cuff Size: Normal)   Pulse 88   Temp 98.1 F (36.7 C) (Oral)   Wt 164 lb 1.9 oz (74.4 kg)   BMI 27.31 kg/m   Constitutional: VS see above. General Appearance: alert, well-developed, well-nourished, NAD  Eyes: Normal lids and conjunctive, non-icteric sclera  Ears, Nose, Mouth, Throat:  TM normal bilaterally.   Neck: No masses, trachea midline. No thyroid enlargement. No tenderness/mass appreciated. No lymphadenopathy  Respiratory: Normal respiratory effort. no wheeze, no rhonchi, no rales  Cardiovascular: S1/S2 normal, no murmur, no rub/gallop auscultated. RRR. No lower extremity edema  Gastrointestinal: Nontender, no masses. No hepatomegaly, no splenomegaly. No hernia appreciated. Bowel sounds normal. Rectal exam deferred.   Musculoskeletal: Gait normal. No clubbing/cyanosis of digits.   Neurological: Normal balance/coordination. No tremor. No cranial nerve deficit on limited exam. Motor and sensation intact and symmetric. Cerebellar reflexes intact.   Skin: warm, dry, intact. No rash/ulcer. No concerning nevi or subq nodules on limited exam.    Psychiatric: Normal judgment/insight. Normal mood and affect. Oriented x3.    No results found for this or any previous visit (from the past 72 hour(s)).  No results found.      ASSESSMENT/PLAN: The primary encounter diagnosis was Annual physical exam. Diagnoses of History of migraine, Mild intermittent chronic asthma without complication, Elevated cholesterol with high triglycerides, Asthma, chronic, mild persistent, uncomplicated, Breast cancer screening by mammogram, and Need for influenza vaccination were also pertinent to this visit.    Orders Placed This Encounter  Procedures  . MM 3D SCREEN BREAST BILATERAL  .  Flu Vaccine QUAD 6+ mos PF IM (Fluarix Quad PF)    Meds ordered this encounter  Medications  . albuterol (VENTOLIN HFA) 108 (90 Base) MCG/ACT inhaler    Sig: Inhale 1-2 puffs into the lungs every 4 (four) hours as needed for wheezing or shortness of breath.    Dispense:  18 g    Refill:  11  . beclomethasone (QVAR) 40 MCG/ACT inhaler    Sig: Inhale 2 puffs into the lungs 2 (two) times daily.    Dispense:  3 Inhaler    Refill:  12  . montelukast (SINGULAIR) 10 MG tablet    Sig: Take 1 tablet (10 mg total) by mouth at bedtime.    Dispense:  90 tablet    Refill:  3  . rizatriptan (MAXALT-MLT) 10 MG disintegrating tablet    Sig: Take 1 tablet (10 mg total) by mouth as needed for migraine. May repeat in 2 hours if needed    Dispense:  10 tablet    Refill:  3  . topiramate (TOPAMAX) 50 MG tablet    Sig: Take 1.5 tablets (75 mg total) by mouth 2 (two) times daily.    Dispense:  270 tablet  Refill:  3  . fluticasone (FLONASE) 50 MCG/ACT nasal spray    Sig: Place 1-2 sprays into both nostrils every other day.    Dispense:  48 g    Refill:  3    Patient Instructions  General Preventive Care  Most recent routine screening lipids/other labs: orders are in!   Everyone should have blood pressure checked once per year.   Tobacco: don't!   Alcohol: responsible moderation is ok for most adults - if you have concerns about your alcohol intake, please talk to me!   Exercise: as tolerated to reduce risk of cardiovascular disease and diabetes. Strength training will also prevent osteoporosis.   Mental health: if need for mental health care (medicines, counseling, other), or concerns about moods, please let me know!   Sexual health: if need for STD testing, or if concerns with libido/pain problems, please let me know! If you need to discuss your birth control options, please let me know!   Advanced Directive: Living Will and/or Healthcare Power of Attorney recommended for all adults,  regardless of age or health.  Vaccines  Flu vaccine: recommended for almost everyone, every fall.   Shingles vaccine: Shingrix recommended after age 36.   Pneumonia vaccines: recommended after age 12  Tetanus booster: Tdap recommended every 10 years. Due 2027.  Cancer screenings   Colon cancer screening: recommended for everyone starting at age 31-50  Breast cancer screening: mammogram recommended at age 50 every other year at least, and annually after age 20.   Cervical cancer screening: Pap due 06/2020  Lung cancer screening: not needed as long as you don't take up smoking again!  Infection screenings . HIV: recommended screening at least once age 48-65 . Gonorrhea/Chlamydia: screening as needed . Hepatitis C: recommended once for anyone born 03-1963 . TB: certain at-risk populations, or depending on work requirements and/or travel history Other . Bone Density Test: recommended for women at age 1        Visit summary with medication list and pertinent instructions was printed for patient to review. All questions at time of visit were answered - patient instructed to contact office with any additional concerns or updates. ER/RTC precautions were reviewed with the patient.     Please note: voice recognition software was used to produce this document, and typos may escape review. Please contact Dr. Sheppard Coil for any needed clarifications.     Follow-up plan: Return in about 1 year (around 09/12/2020) for [place] (call week prior to visit for lab orders).

## 2019-09-14 LAB — CBC WITH DIFFERENTIAL/PLATELET
Absolute Monocytes: 609 cells/uL (ref 200–950)
Basophils Absolute: 78 cells/uL (ref 0–200)
Basophils Relative: 0.9 %
Eosinophils Absolute: 461 cells/uL (ref 15–500)
Eosinophils Relative: 5.3 %
HCT: 41.3 % (ref 35.0–45.0)
Hemoglobin: 13.8 g/dL (ref 11.7–15.5)
Lymphs Abs: 2923 cells/uL (ref 850–3900)
MCH: 29.1 pg (ref 27.0–33.0)
MCHC: 33.4 g/dL (ref 32.0–36.0)
MCV: 86.9 fL (ref 80.0–100.0)
MPV: 10.4 fL (ref 7.5–12.5)
Monocytes Relative: 7 %
Neutro Abs: 4628 cells/uL (ref 1500–7800)
Neutrophils Relative %: 53.2 %
Platelets: 275 10*3/uL (ref 140–400)
RBC: 4.75 10*6/uL (ref 3.80–5.10)
RDW: 12.2 % (ref 11.0–15.0)
Total Lymphocyte: 33.6 %
WBC: 8.7 10*3/uL (ref 3.8–10.8)

## 2019-09-14 LAB — COMPLETE METABOLIC PANEL WITH GFR
AG Ratio: 1.7 (calc) (ref 1.0–2.5)
ALT: 14 U/L (ref 6–29)
AST: 15 U/L (ref 10–30)
Albumin: 4.2 g/dL (ref 3.6–5.1)
Alkaline phosphatase (APISO): 50 U/L (ref 31–125)
BUN: 15 mg/dL (ref 7–25)
CO2: 24 mmol/L (ref 20–32)
Calcium: 9.5 mg/dL (ref 8.6–10.2)
Chloride: 107 mmol/L (ref 98–110)
Creat: 0.83 mg/dL (ref 0.50–1.10)
GFR, Est African American: 101 mL/min/{1.73_m2} (ref >=60)
GFR, Est Non African American: 87 mL/min/{1.73_m2} (ref >=60)
Globulin: 2.5 g/dL (calc) (ref 1.9–3.7)
Glucose, Bld: 94 mg/dL (ref 65–99)
Potassium: 3.8 mmol/L (ref 3.5–5.3)
Sodium: 140 mmol/L (ref 135–146)
Total Bilirubin: 0.4 mg/dL (ref 0.2–1.2)
Total Protein: 6.7 g/dL (ref 6.1–8.1)

## 2019-09-14 LAB — LIPID PANEL W/REFLEX DIRECT LDL
Cholesterol: 171 mg/dL (ref <200)
HDL: 49 mg/dL — ABNORMAL LOW (ref >=50)
LDL Cholesterol (Calc): 103 mg/dL (calc) — ABNORMAL HIGH
Non-HDL Cholesterol (Calc): 122 mg/dL (calc) (ref <130)
Total CHOL/HDL Ratio: 3.5 (calc) (ref <5.0)
Triglycerides: 98 mg/dL (ref <150)

## 2019-10-05 ENCOUNTER — Other Ambulatory Visit: Payer: Self-pay

## 2019-10-05 ENCOUNTER — Ambulatory Visit (INDEPENDENT_AMBULATORY_CARE_PROVIDER_SITE_OTHER): Payer: Managed Care, Other (non HMO)

## 2019-10-05 DIAGNOSIS — Z0000 Encounter for general adult medical examination without abnormal findings: Secondary | ICD-10-CM | POA: Diagnosis not present

## 2019-10-05 DIAGNOSIS — Z1231 Encounter for screening mammogram for malignant neoplasm of breast: Secondary | ICD-10-CM

## 2020-09-16 ENCOUNTER — Ambulatory Visit (INDEPENDENT_AMBULATORY_CARE_PROVIDER_SITE_OTHER): Payer: Managed Care, Other (non HMO) | Admitting: Osteopathic Medicine

## 2020-09-16 ENCOUNTER — Other Ambulatory Visit: Payer: Self-pay

## 2020-09-16 ENCOUNTER — Encounter: Payer: Self-pay | Admitting: Osteopathic Medicine

## 2020-09-16 VITALS — BP 91/58 | HR 96 | Ht 65.0 in | Wt 157.0 lb

## 2020-09-16 DIAGNOSIS — E7800 Pure hypercholesterolemia, unspecified: Secondary | ICD-10-CM

## 2020-09-16 DIAGNOSIS — Z1231 Encounter for screening mammogram for malignant neoplasm of breast: Secondary | ICD-10-CM

## 2020-09-16 DIAGNOSIS — Z0000 Encounter for general adult medical examination without abnormal findings: Secondary | ICD-10-CM | POA: Diagnosis not present

## 2020-09-16 DIAGNOSIS — J4520 Mild intermittent asthma, uncomplicated: Secondary | ICD-10-CM

## 2020-09-16 DIAGNOSIS — Z8669 Personal history of other diseases of the nervous system and sense organs: Secondary | ICD-10-CM

## 2020-09-16 DIAGNOSIS — J4530 Mild persistent asthma, uncomplicated: Secondary | ICD-10-CM

## 2020-09-16 DIAGNOSIS — Z23 Encounter for immunization: Secondary | ICD-10-CM | POA: Diagnosis not present

## 2020-09-16 MED ORDER — BUDESONIDE-FORMOTEROL FUMARATE 160-4.5 MCG/ACT IN AERO: 2.0000 | INHALATION_SPRAY | Freq: Two times a day (BID) | RESPIRATORY_TRACT | 3 refills | Status: DC

## 2020-09-16 MED ORDER — MELOXICAM 15 MG PO TABS: 7.5000 mg | ORAL_TABLET | Freq: Every day | ORAL | 1 refills | Status: DC

## 2020-09-16 MED ORDER — TOPIRAMATE 50 MG PO TABS: 75.0000 mg | ORAL_TABLET | Freq: Two times a day (BID) | ORAL | 3 refills | Status: DC

## 2020-09-16 MED ORDER — MONTELUKAST SODIUM 10 MG PO TABS: 10.0000 mg | ORAL_TABLET | Freq: Every day | ORAL | 3 refills | Status: DC

## 2020-09-16 MED ORDER — RIZATRIPTAN BENZOATE 10 MG PO TBDP: 10.0000 mg | ORAL_TABLET | ORAL | 3 refills | Status: DC | PRN

## 2020-09-16 NOTE — Progress Notes (Signed)
HPI: Candice Hanson is a 44 y.o. female who  has a past medical history of Allergy, Asthma, and Migraine headache.  she presents to [facility] today, 09/16/20,  for chief complaint of:  Annual Physical  Some SOB on exertion, known asthma, feels worse in winter, worse since COVID infection last year     ASSESSMENT/PLAN: The primary encounter diagnosis was Annual physical exam. Diagnoses of History of migraine, Mild intermittent chronic asthma without complication, Elevated LDL cholesterol level, Need for immunization against influenza, Asthma, chronic, mild persistent, uncomplicated, and Breast cancer screening by mammogram were also pertinent to this visit.  1. Annual physical exam Preventive care below  2. History of migraine Controlled , refilled current Rx  3. Mild intermittent chronic asthma without complication Possible long COVID making this worse, trial Symbicort in place of Qvar and encourage continue albuterol pre-exertion   4. Elevated LDL cholesterol level Lipid panel pending   5. Need for immunization against influenza  7. Breast cancer screening by mammogram   Orders Placed This Encounter  Procedures  . MM 3D SCREEN BREAST BILATERAL  . Flu Vaccine QUAD 36+ mos IM  . CBC  . COMPLETE METABOLIC PANEL WITH GFR  . Lipid panel     Meds ordered this encounter  Medications  . budesonide-formoterol (SYMBICORT) 160-4.5 MCG/ACT inhaler    Sig: Inhale 2 puffs into the lungs 2 (two) times daily.    Dispense:  1 each    Refill:  3  . meloxicam (MOBIC) 15 MG tablet    Sig: Take 0.5-1 tablets (7.5-15 mg total) by mouth daily. Prn aches/pains    Dispense:  90 tablet    Refill:  1  . montelukast (SINGULAIR) 10 MG tablet    Sig: Take 1 tablet (10 mg total) by mouth at bedtime.    Dispense:  90 tablet    Refill:  3  . rizatriptan (MAXALT-MLT) 10 MG disintegrating tablet    Sig: Take 1 tablet (10 mg total) by mouth as needed for  migraine. May repeat in 2 hours if needed    Dispense:  10 tablet    Refill:  3  . topiramate (TOPAMAX) 50 MG tablet    Sig: Take 1.5 tablets (75 mg total) by mouth 2 (two) times daily.    Dispense:  270 tablet    Refill:  3    Patient Instructions  General Preventive Care  Most recent routine screening labs: ordered today.   Blood pressure goal 130/80 or less.   Tobacco: don't!   Alcohol: responsible moderation is ok for most adults - if you have concerns about your alcohol intake, please talk to me!   Exercise: as tolerated to reduce risk of cardiovascular disease and diabetes. Strength training will also prevent osteoporosis.   Mental health: if need for mental health care (medicines, counseling, other), or concerns about moods, please let me know!   Sexual / Reproductive health: if need for STD testing, or if concerns with libido/pain problems, or if you need to discuss family planning, please let me or OBGYN know!   Advanced Directive: Living Will and/or Healthcare Power of Attorney recommended for all adults, regardless of age or health.  Vaccines  Flu vaccine: every fall.   Shingles vaccine: after age 84.   Pneumonia vaccines: booster after age 67  Tetanus booster: every 10 years (due 2027) / 3rd trimester of pregnancy  COVID vaccine: THANKS for getting your vaccine! :) Cancer screenings   Colon cancer  screening: for everyone age 51-75. Colonoscopy available for all, many people also qualify for the Cologuard stool test   Breast cancer screening: mammogram at age 28 every other year at least, and annually after age 43.   Cervical cancer screening: Pap every 5 years if normal  Lung cancer screening: not needed for non-smokers if quit greater than 15 years ago Infection screenings  . HIV: recommended screening at least once age 49-65, more often as needed. . Gonorrhea/Chlamydia: screening as needed . Hepatitis C: recommended once for everyone age 37-75 . TB:  certain at-risk populations, or depending on work requirements and/or travel history Other . Bone Density Test: recommended for women at age 89     Follow-up plan: Return in about 1 year (around 09/16/2021) for [place] (call week prior to visit for lab orders).                                                 ################################################# ################################################# ################################################# #################################################    No outpatient medications have been marked as taking for the 09/16/20 encounter (Office Visit) with Emeterio Reeve, DO.    No Known Allergies     Review of Systems: Pertinent (+) and (-) ROS in HPI as above   Exam:  BP (!) 91/58   Pulse 96   Ht 5' 5"  (1.651 m)   Wt 157 lb (71.2 kg)   BMI 26.13 kg/m   Constitutional: VS see above. General Appearance: alert, well-developed, well-nourished, NAD  Neck: No masses, trachea midline.   Respiratory: Normal respiratory effort. no wheeze, no rhonchi, no rales  Cardiovascular: S1/S2 normal, no murmur, no rub/gallop auscultated. RRR.   Musculoskeletal: Gait normal. Symmetric and independent movement of all extremities  Abdominal: non-tender, non-distended, no appreciable organomegaly, neg Murphy's, BS [id number]  Neurological: Normal balance/coordination. No tremor.  Skin: warm, dry, intact.   Psychiatric: Normal judgment/insight. Normal mood and affect. Oriented x3.       Visit summary with medication list and pertinent instructions was printed for patient to review, patient was advised to alert [place] if any updates are needed. All questions at time of visit were answered - patient instructed to contact office with any additional concerns. ER/RTC precautions were reviewed with the patient and understanding verbalized.        Please note: voice recognition software was  used to produce this document, and typos may escape review. Please contact Dr. Sheppard Coil for any needed clarifications.    Follow up plan: Return in about 1 year (around 09/16/2021) for [place] (call week prior to visit for lab orders).

## 2020-09-16 NOTE — Patient Instructions (Signed)
General Preventive Care  Most recent routine screening labs: ordered today.   Blood pressure goal 130/80 or less.   Tobacco: don't!   Alcohol: responsible moderation is ok for most adults - if you have concerns about your alcohol intake, please talk to me!   Exercise: as tolerated to reduce risk of cardiovascular disease and diabetes. Strength training will also prevent osteoporosis.   Mental health: if need for mental health care (medicines, counseling, other), or concerns about moods, please let me know!   Sexual / Reproductive health: if need for STD testing, or if concerns with libido/pain problems, or if you need to discuss family planning, please let me or OBGYN know!   Advanced Directive: Living Will and/or Healthcare Power of Attorney recommended for all adults, regardless of age or health.  Vaccines  Flu vaccine: every fall.   Shingles vaccine: after age 29.   Pneumonia vaccines: booster after age 29  Tetanus booster: every 10 years (due 2027) / 3rd trimester of pregnancy  COVID vaccine: THANKS for getting your vaccine! :) Cancer screenings   Colon cancer screening: for everyone age 70-75. Colonoscopy available for all, many people also qualify for the Cologuard stool test   Breast cancer screening: mammogram at age 69 every other year at least, and annually after age 76.   Cervical cancer screening: Pap every 5 years if normal  Lung cancer screening: not needed for non-smokers if quit greater than 15 years ago Infection screenings  . HIV: recommended screening at least once age 53-65, more often as needed. . Gonorrhea/Chlamydia: screening as needed . Hepatitis C: recommended once for everyone age 25-75 . TB: certain at-risk populations, or depending on work requirements and/or travel history Other . Bone Density Test: recommended for women at age 6

## 2020-09-18 LAB — COMPLETE METABOLIC PANEL WITH GFR
AG Ratio: 1.9 (calc) (ref 1.0–2.5)
ALT: 17 U/L (ref 6–29)
AST: 19 U/L (ref 10–30)
Albumin: 4.2 g/dL (ref 3.6–5.1)
Alkaline phosphatase (APISO): 57 U/L (ref 31–125)
BUN: 9 mg/dL (ref 7–25)
CO2: 25 mmol/L (ref 20–32)
Calcium: 9.2 mg/dL (ref 8.6–10.2)
Chloride: 106 mmol/L (ref 98–110)
Creat: 0.95 mg/dL (ref 0.50–1.10)
GFR, Est African American: 85 mL/min/{1.73_m2} (ref >=60)
GFR, Est Non African American: 73 mL/min/{1.73_m2} (ref >=60)
Globulin: 2.2 g/dL (calc) (ref 1.9–3.7)
Glucose, Bld: 90 mg/dL (ref 65–99)
Potassium: 3.9 mmol/L (ref 3.5–5.3)
Sodium: 138 mmol/L (ref 135–146)
Total Bilirubin: 0.7 mg/dL (ref 0.2–1.2)
Total Protein: 6.4 g/dL (ref 6.1–8.1)

## 2020-09-18 LAB — CBC
HCT: 41.4 % (ref 35.0–45.0)
Hemoglobin: 13.9 g/dL (ref 11.7–15.5)
MCH: 29.7 pg (ref 27.0–33.0)
MCHC: 33.6 g/dL (ref 32.0–36.0)
MCV: 88.5 fL (ref 80.0–100.0)
MPV: 10.8 fL (ref 7.5–12.5)
Platelets: 257 10*3/uL (ref 140–400)
RBC: 4.68 10*6/uL (ref 3.80–5.10)
RDW: 12.2 % (ref 11.0–15.0)
WBC: 8.5 10*3/uL (ref 3.8–10.8)

## 2020-09-18 LAB — LIPID PANEL
Cholesterol: 186 mg/dL (ref <200)
HDL: 49 mg/dL — ABNORMAL LOW (ref >=50)
LDL Cholesterol (Calc): 114 mg/dL (calc) — ABNORMAL HIGH
Non-HDL Cholesterol (Calc): 137 mg/dL (calc) — ABNORMAL HIGH (ref <130)
Total CHOL/HDL Ratio: 3.8 (calc) (ref <5.0)
Triglycerides: 119 mg/dL (ref <150)

## 2021-07-04 ENCOUNTER — Other Ambulatory Visit: Payer: Self-pay | Admitting: Osteopathic Medicine

## 2021-07-04 DIAGNOSIS — J4530 Mild persistent asthma, uncomplicated: Secondary | ICD-10-CM

## 2021-07-08 IMAGING — MG DIGITAL SCREENING BILAT W/ TOMO W/ CAD
6 of 10 series · 6 of 30 positions shown · non-contrast
Comparison: None.

CLINICAL DATA: Screening.

EXAM:
DIGITAL SCREENING BILATERAL MAMMOGRAM WITH TOMO AND CAD

[L CC synth-2D]
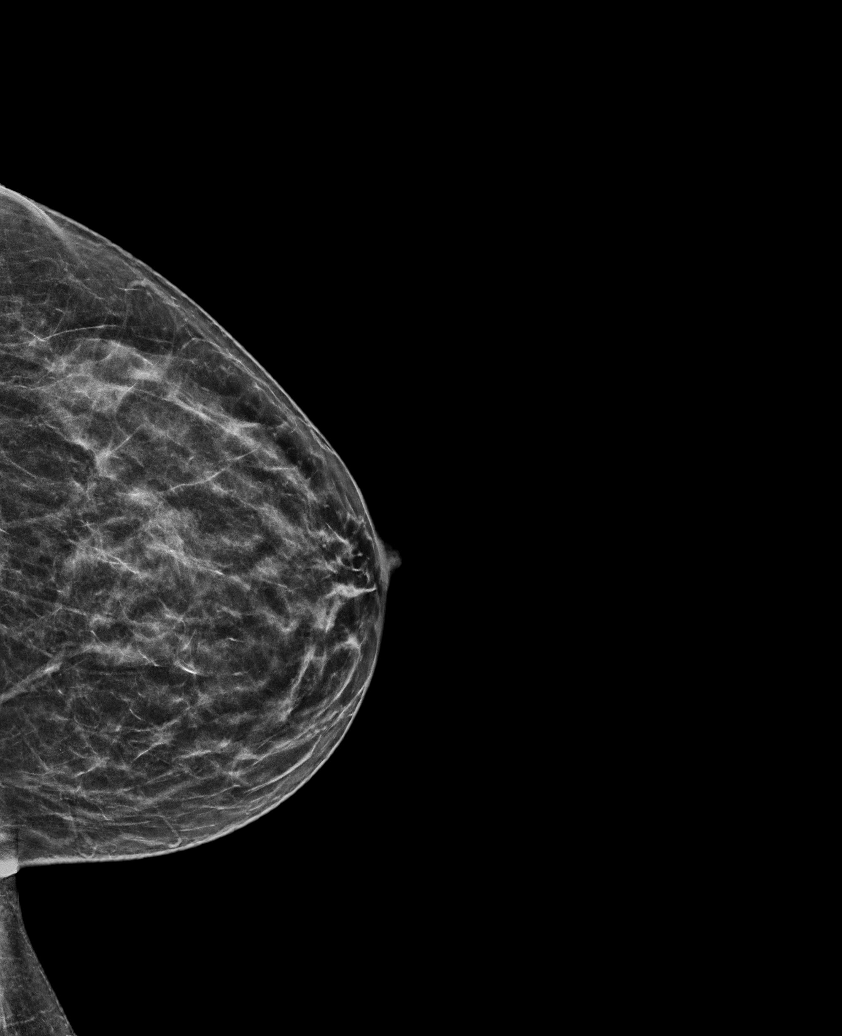

[R CC synth-2D]
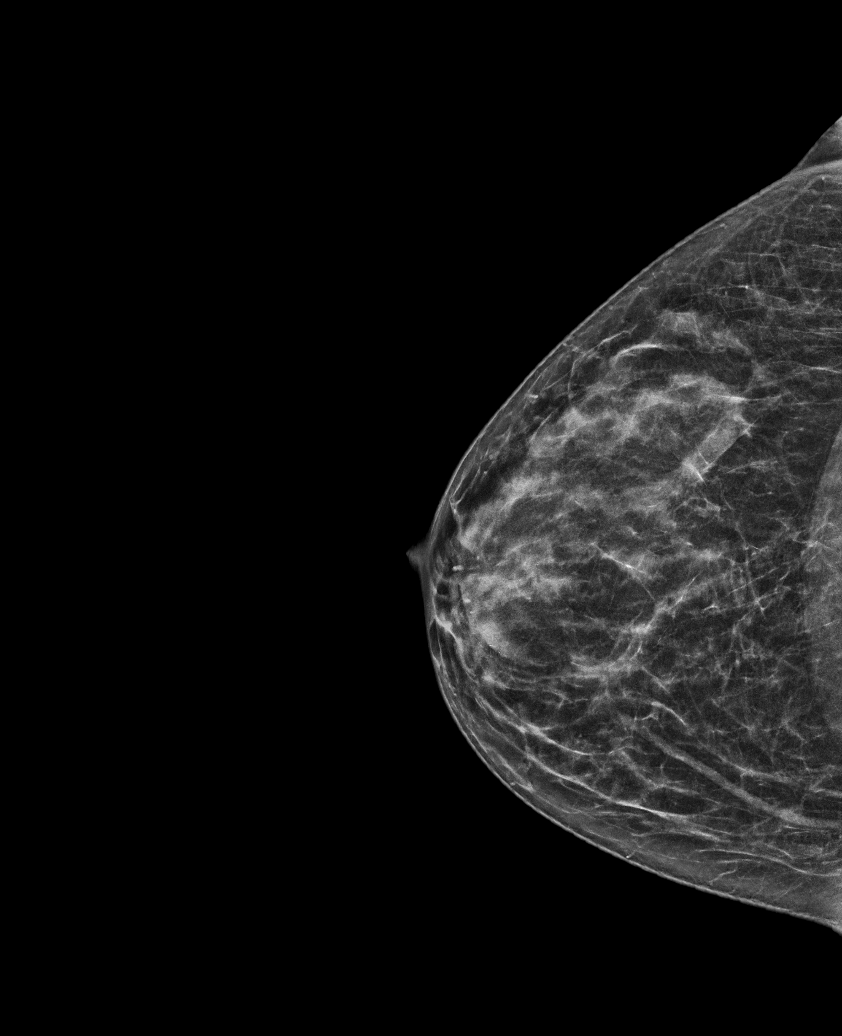

[L XCCL synth-2D]
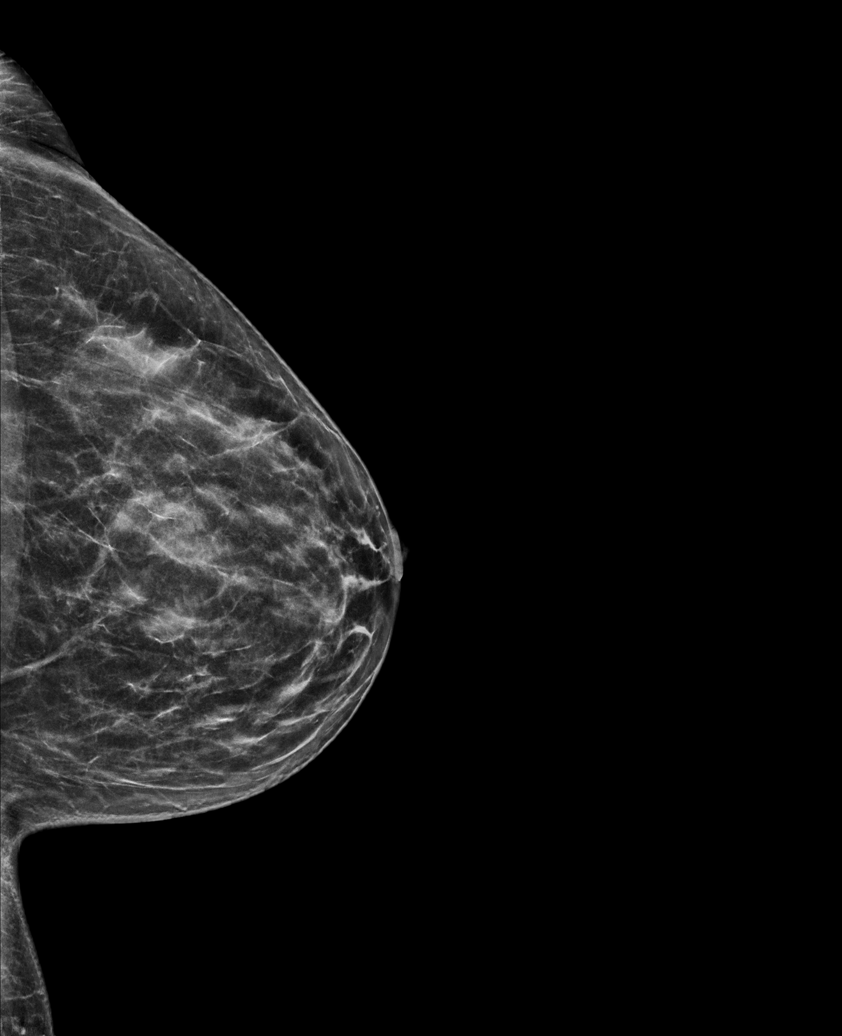

[L MLO synth-2D]
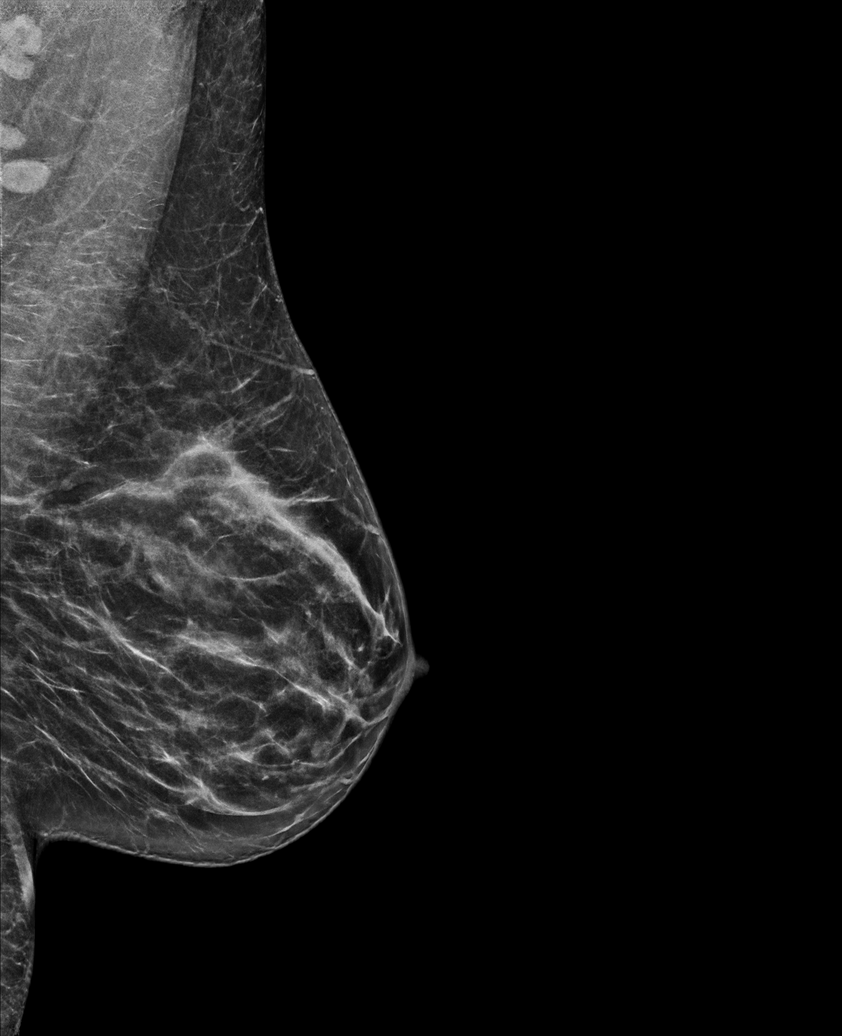

[R MLO synth-2D]
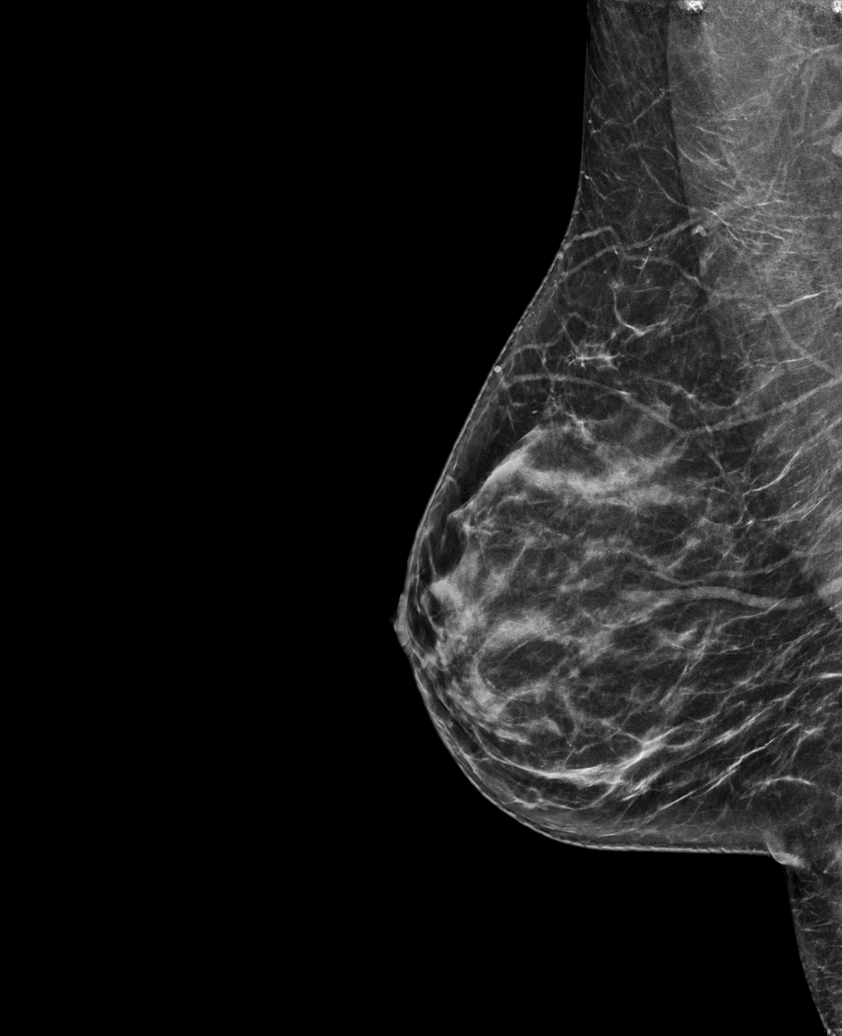

[R MLO tomo · tomo slice 31/62.0]
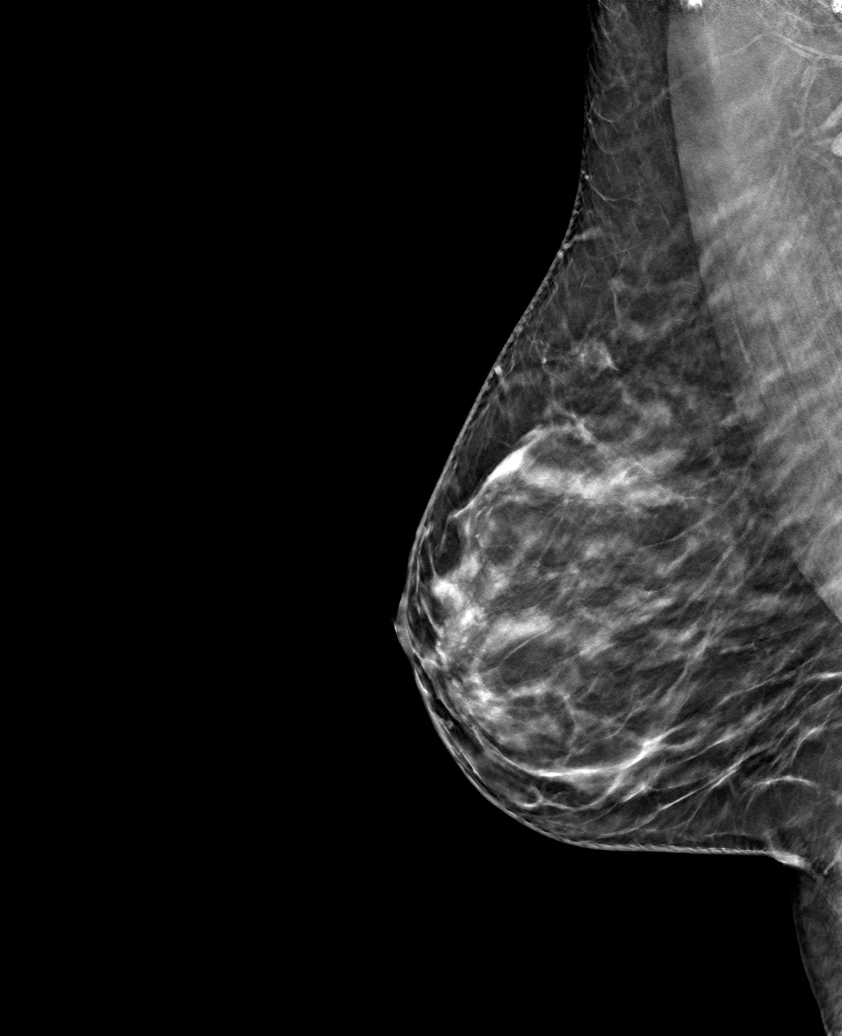

[6 of 30 positions shown; findings below may reference images not displayed]

ACR Breast Density Category c: The breast tissue is heterogeneously
dense, which may obscure small masses
FINDINGS: There are no findings suspicious for malignancy. Images were
processed with CAD.
IMPRESSION: No mammographic evidence of malignancy. A result letter of this
screening mammogram will be mailed directly to the patient.

RECOMMENDATION:
Screening mammogram in one year. (Code:EM-2-IHY)

BI-RADS CATEGORY  1: Negative.

## 2021-07-17 ENCOUNTER — Telehealth: Payer: Self-pay

## 2021-07-17 ENCOUNTER — Other Ambulatory Visit: Payer: Self-pay | Admitting: Osteopathic Medicine

## 2021-07-17 DIAGNOSIS — J4520 Mild intermittent asthma, uncomplicated: Secondary | ICD-10-CM

## 2021-07-17 DIAGNOSIS — J4530 Mild persistent asthma, uncomplicated: Secondary | ICD-10-CM

## 2021-07-17 NOTE — Telephone Encounter (Signed)
Patient/[facility] requesting med refill for Qvar. Per patient, she is currently out of the inhaler. Rx not listed in active med list.

## 2021-07-18 NOTE — Telephone Encounter (Signed)
I see Symbicort on the list, this has a similar active ingredient as Qvar but an additional bronchodilator, she really should not be on both, if she wants to be on Qvar have her let me know which dose and I will discontinue Symbicort, otherwise I will refill Symbicort.

## 2021-07-21 MED ORDER — QVAR REDIHALER 40 MCG/ACT IN AERB: 1.0000 | INHALATION_SPRAY | Freq: Two times a day (BID) | RESPIRATORY_TRACT | 11 refills | Status: DC

## 2021-07-21 NOTE — Telephone Encounter (Signed)
Discontinue Symbicort, refilling Qvar.

## 2021-07-21 NOTE — Telephone Encounter (Signed)
Patient returned a call back to the clinic. Requesting for provider to remove Symbicort from active med list. It is no longer covered by their insurance. Patient is requesting a refill for Qvar inhaler. Thanks in advance.

## 2021-07-22 ENCOUNTER — Other Ambulatory Visit: Payer: Self-pay | Admitting: Osteopathic Medicine

## 2021-07-22 DIAGNOSIS — J4530 Mild persistent asthma, uncomplicated: Secondary | ICD-10-CM

## 2021-07-25 NOTE — Telephone Encounter (Signed)
Task completed. Patient was updated that Qvar inhaler sent to local pharmacy. No other inquiries during the call.

## 2021-09-16 ENCOUNTER — Ambulatory Visit: Payer: Managed Care, Other (non HMO) | Admitting: Osteopathic Medicine

## 2021-10-17 ENCOUNTER — Other Ambulatory Visit: Payer: Self-pay | Admitting: Osteopathic Medicine

## 2021-10-17 DIAGNOSIS — Z8669 Personal history of other diseases of the nervous system and sense organs: Secondary | ICD-10-CM

## 2021-12-04 ENCOUNTER — Other Ambulatory Visit (HOSPITAL_COMMUNITY)
Admission: RE | Admit: 2021-12-04 | Discharge: 2021-12-04 | Disposition: A | Discharge status: Home / Self Care | Payer: BC Managed Care – PPO | Source: Ambulatory Visit | Attending: Obstetrics & Gynecology | Admitting: Obstetrics & Gynecology

## 2021-12-04 ENCOUNTER — Other Ambulatory Visit: Payer: Self-pay

## 2021-12-04 ENCOUNTER — Encounter: Payer: Self-pay | Admitting: Obstetrics & Gynecology

## 2021-12-04 ENCOUNTER — Ambulatory Visit (INDEPENDENT_AMBULATORY_CARE_PROVIDER_SITE_OTHER): Payer: BC Managed Care – PPO | Admitting: Obstetrics & Gynecology

## 2021-12-04 VITALS — BP 124/75 | HR 96 | Ht 65.0 in | Wt 149.0 lb

## 2021-12-04 DIAGNOSIS — Z01419 Encounter for gynecological examination (general) (routine) without abnormal findings: Secondary | ICD-10-CM

## 2021-12-04 DIAGNOSIS — N898 Other specified noninflammatory disorders of vagina: Secondary | ICD-10-CM | POA: Diagnosis not present

## 2021-12-04 DIAGNOSIS — N921 Excessive and frequent menstruation with irregular cycle: Secondary | ICD-10-CM | POA: Diagnosis not present

## 2021-12-04 DIAGNOSIS — K649 Unspecified hemorrhoids: Secondary | ICD-10-CM

## 2021-12-04 MED ORDER — HYDROCORT-PRAMOXINE (PERIANAL) 1-1 % EX FOAM: 1.0000 | Freq: Two times a day (BID) | CUTANEOUS | Status: DC

## 2021-12-04 NOTE — Progress Notes (Signed)
Last pap- 06/20/15- normal   Subjective:     Candice Hanson is a 46 y.o. female here for a routine exam.  Current complaints: hot flashes, skipping menses some months and then has a very heavy menses.  This has been happening 6 months.      Gynecologic History No LMP recorded. (Menstrual status: IUD). Contraception: IUD  Due to come out 2024  Last Pap: 2016. Results were: normal Last mammogram: 2020. Results were: normal  Obstetric History OB History  Gravida Para Term Preterm AB Living  3 3 3         SAB IAB Ectopic Multiple Live Births               # Outcome Date GA Lbr Len/2nd Weight Sex Delivery Anes PTL Lv  3 Term      Vag-Spont     2 Term      Vag-Spont     1 Term      Vag-Spont        The following portions of the patient's history were reviewed and updated as appropriate: allergies, current medications, past family history, past medical history, past social history, past surgical history, and problem list.  Review of Systems Pertinent items noted in HPI and remainder of comprehensive ROS otherwise negative.    Objective:     Vitals:   12/04/21 0847  BP: 124/75  Pulse: 96  Weight: 149 lb (67.6 kg)  Height: 5' 5"  (1.651 m)   Vitals:  WNL General appearance: alert, cooperative and no distress  HEENT: Normocephalic, without obvious abnormality, atraumatic Eyes: negative Throat: lips, mucosa, and tongue normal; teeth and gums normal  Respiratory: Clear to auscultation bilaterally  CV: Regular rate and rhythm  Breasts:  Normal appearance, no masses or tenderness, no nipple retraction or dimpling  GI: Soft, non-tender; bowel sounds normal; no masses,  no organomegaly  GU: External Genitalia:  Tanner V, no lesion Urethra:  No prolapse   Vagina: Pink, normal rugae, no blood or discharge  Cervix: No CMT, no lesion  Uterus:  Normal size and contour, non tender  Adnexa: Normal, no masses, non tender  Musculoskeletal: No edema, redness or tenderness in the calves or  thighs  Skin: No lesions or rash  Lymphatic: Axillary adenopathy: none     Psychiatric: Normal mood and behavior   Family history:  unknown; pt adopted     Assessment:    Healthy female exam.    Plan:    Pap with co testing Yearly mammograms Good calcium intake Hemorrhoid---anusol and or proctofoam Menomet with IUD--Pelvic US complete with TVUS Vaginal mass--re eval in 2-3 weeks.  If still present, consider biopsy.  Patient to decide between Cologuard and colonoscopy.  I sent her a patient message at the end of the visit and she will let us know what she chooses. Return to clinic in 2 weeks to evaluate vaginal mass, review ultrasound results and see if over-the-counter and prescription medications have helped her hemorrhoid.  If not better, refer to general surgery.

## 2021-12-08 ENCOUNTER — Other Ambulatory Visit: Payer: Self-pay | Admitting: Osteopathic Medicine

## 2021-12-08 DIAGNOSIS — J4530 Mild persistent asthma, uncomplicated: Secondary | ICD-10-CM

## 2021-12-08 LAB — CYTOLOGY - PAP
Comment: NEGATIVE
Diagnosis: NEGATIVE
Diagnosis: REACTIVE
High risk HPV: NEGATIVE

## 2021-12-10 ENCOUNTER — Other Ambulatory Visit: Payer: Self-pay | Admitting: Obstetrics & Gynecology

## 2021-12-10 DIAGNOSIS — Z1211 Encounter for screening for malignant neoplasm of colon: Secondary | ICD-10-CM

## 2021-12-18 ENCOUNTER — Ambulatory Visit (INDEPENDENT_AMBULATORY_CARE_PROVIDER_SITE_OTHER): Payer: BC Managed Care – PPO

## 2021-12-18 ENCOUNTER — Other Ambulatory Visit: Payer: Self-pay

## 2021-12-18 DIAGNOSIS — Z1231 Encounter for screening mammogram for malignant neoplasm of breast: Secondary | ICD-10-CM

## 2021-12-18 DIAGNOSIS — N921 Excessive and frequent menstruation with irregular cycle: Secondary | ICD-10-CM

## 2021-12-18 DIAGNOSIS — N888 Other specified noninflammatory disorders of cervix uteri: Secondary | ICD-10-CM | POA: Diagnosis not present

## 2021-12-18 DIAGNOSIS — Z01419 Encounter for gynecological examination (general) (routine) without abnormal findings: Secondary | ICD-10-CM

## 2021-12-18 DIAGNOSIS — K649 Unspecified hemorrhoids: Secondary | ICD-10-CM

## 2021-12-18 DIAGNOSIS — Z975 Presence of (intrauterine) contraceptive device: Secondary | ICD-10-CM | POA: Diagnosis not present

## 2021-12-18 MED ORDER — PRAMOXINE HCL (PERIANAL) 1 % EX FOAM: 1.0000 "application " | Freq: Three times a day (TID) | CUTANEOUS | 0 refills | Status: DC | PRN

## 2021-12-25 ENCOUNTER — Ambulatory Visit: Payer: BC Managed Care – PPO | Admitting: Medical-Surgical

## 2021-12-30 DIAGNOSIS — Z1211 Encounter for screening for malignant neoplasm of colon: Secondary | ICD-10-CM | POA: Diagnosis not present

## 2022-01-04 ENCOUNTER — Other Ambulatory Visit: Payer: Self-pay | Admitting: Medical-Surgical

## 2022-01-04 DIAGNOSIS — J4530 Mild persistent asthma, uncomplicated: Secondary | ICD-10-CM

## 2022-01-06 LAB — COLOGUARD: COLOGUARD: NEGATIVE

## 2022-01-21 ENCOUNTER — Other Ambulatory Visit: Payer: Self-pay | Admitting: Medical-Surgical

## 2022-01-21 DIAGNOSIS — J4530 Mild persistent asthma, uncomplicated: Secondary | ICD-10-CM

## 2022-02-02 ENCOUNTER — Other Ambulatory Visit (HOSPITAL_COMMUNITY)
Admission: RE | Admit: 2022-02-02 | Discharge: 2022-02-02 | Disposition: A | Discharge status: Home / Self Care | Payer: BC Managed Care – PPO | Source: Ambulatory Visit | Attending: Obstetrics & Gynecology | Admitting: Obstetrics & Gynecology

## 2022-02-02 ENCOUNTER — Ambulatory Visit (INDEPENDENT_AMBULATORY_CARE_PROVIDER_SITE_OTHER): Payer: BC Managed Care – PPO | Admitting: Obstetrics & Gynecology

## 2022-02-02 ENCOUNTER — Encounter: Payer: Self-pay | Admitting: Obstetrics & Gynecology

## 2022-02-02 ENCOUNTER — Ambulatory Visit: Payer: BC Managed Care – PPO | Admitting: Medical-Surgical

## 2022-02-02 VITALS — BP 112/70 | HR 90 | Ht 65.0 in | Wt 148.0 lb

## 2022-02-02 DIAGNOSIS — N898 Other specified noninflammatory disorders of vagina: Secondary | ICD-10-CM | POA: Insufficient documentation

## 2022-02-02 NOTE — Progress Notes (Signed)
? ?  Subjective:  ? ? Patient ID: Candice Hanson Begin, female    DOB: 01-13-1976, 46 y.o.   MRN: [id number] ? ?HPI ? ?46 year old female presents for follow-up of menorrhagia with IUD as well as a vaginal mass and a hemorrhoid.  The hemorrhoid has gotten better with the change in her diet as well as the over-the-counter preparations.  She does not need a referral to general surgery at this time.  The patient had a transvaginal ultrasound which showed no abnormalities.  The patient is no longer experiencing bleeding abnormalities.  Patient is here also to have the vaginal mass evaluated.  It was calling no symptoms before and currently not causing any symptoms. ? ?Review of Systems  ?Constitutional: Negative.   ?Respiratory: Negative.    ?Cardiovascular: Negative.   ?Gastrointestinal: Negative.   ?Genitourinary: Negative.   ? ?   ?Objective:  ? Physical Exam ?Vitals reviewed.  ?Constitutional:   ?   General: She is not in acute distress. ?   Appearance: She is well-developed.  ?HENT:  ?   Head: Normocephalic and atraumatic.  ?Eyes:  ?   Conjunctiva/sclera: Conjunctivae normal.  ?Cardiovascular:  ?   Rate and Rhythm: Normal rate.  ?Pulmonary:  ?   Effort: Pulmonary effort is normal.  ?Genitourinary: ?   Comments: Centimeter mass at the apex of the right side of the vagina.  Flesh-colored.  No change in size since last exam. ?Skin: ?   General: Skin is warm and dry.  ?Neurological:  ?   Mental Status: She is alert and oriented to person, place, and time.  ?Psychiatric:     ?   Mood and Affect: Mood normal.  ? ?VAGINAL BIOPSY NOTE ? ?The indications for vaginal biopsy (rule out neoplasia) were reviewed.   Risks of the biopsy including pain, bleeding, infection, inadequate specimen, and need for additional procedures  were discussed. The patient stated understanding and agreed to undergo procedure today. Consent was signed,  time out performed.  ?The patient's vagina was prepped with Betadine. A Kevorkian forceps was used to  obtain tissue at the apex of the right side of the vagina, near the level of the cervix.  No bleeding was noted.  The patient tolerated the procedure well. The patient is to call with heavy bleeding, fever greater than 100.4, foul smelling vaginal discharge or other concerns. Results will be published in My Chart ? ?   ?Assessment & Plan:  ?47 year old woman with the following issues: ?Menorrhagia resolved.  IUD in the correct position.  No further management needed. ?External hemorrhoid is better with diet modifications and OTC preparations.  No referral to general surgery needed at this time. ?Vaginal biopsy as above.  If benign, no further treatment needed. ? ?

## 2022-02-03 ENCOUNTER — Encounter: Payer: Self-pay | Admitting: Obstetrics & Gynecology

## 2022-02-03 DIAGNOSIS — N898 Other specified noninflammatory disorders of vagina: Secondary | ICD-10-CM | POA: Insufficient documentation

## 2022-02-03 LAB — SURGICAL PATHOLOGY

## 2022-02-04 NOTE — Progress Notes (Signed)
? ?Complete physical exam ? ?Patient: Candice Hanson   DOB: 04/02/1976   46 y.o. Female  MRN: [id number] ? ?Subjective:  ?  ?Chief Complaint  ?Patient presents with  ? Transitions Of Care  ? Annual Exam  ? ? ?Candice Hanson is a 46 y.o. female who presents today for a complete physical exam. She reports consuming a general diet. Home exercise routine includes calisthenics, walking her dogs for variable hrs per day, and weights. She generally feels well. She reports sleeping well. She does not have additional problems to discuss today.  ? ? ?Most recent fall risk assessment: ? ?  02/05/2022  ? 11:04 AM  ?Fall Risk   ?Falls in the past year? 0  ?Number falls in past yr: 0  ?Injury with Fall? 0  ?Risk for fall due to : No Fall Risks  ?Follow up Falls evaluation completed  ? ?  ?Most recent depression screenings: ? ?  02/05/2022  ? 11:03 AM 09/16/2020  ?  8:43 AM  ?PHQ 2/9 Scores  ?PHQ - 2 Score 0 0  ? ? ?Vision:Within last year and Dental: No current dental problems and Receives regular dental care ? ? ? ?Patient Care Team: ?Samuel Bouche, NP as PCP - General (Nurse Practitioner)  ? ?Outpatient Medications Prior to Visit  ?Medication Sig  ? levonorgestrel (MIRENA) 20 MCG/24HR IUD 1 each by Intrauterine route once.  ? Multiple Vitamins-Minerals (MULTIVITAMIN ADULT PO) Take by mouth.  ? pramoxine (PROCTOFOAM) 1 % foam Place 1 application rectally 3 (three) times daily as needed for anal itching.  ? Turmeric 500 MG CAPS   ? [DISCONTINUED] albuterol (VENTOLIN HFA) 108 (90 Base) MCG/ACT inhaler INHALE 1-2 PUFFS INTO THE LUNGS EVERY 4 (FOUR) HOURS AS NEEDED FOR WHEEZING OR SHORTNESS OF BREATH.  ? [DISCONTINUED] beclomethasone (QVAR REDIHALER) 40 MCG/ACT inhaler   ? [DISCONTINUED] meloxicam (MOBIC) 15 MG tablet Take 0.5-1 tablets (7.5-15 mg total) by mouth daily. Prn aches/pains  ? [DISCONTINUED] montelukast (SINGULAIR) 10 MG tablet TAKE 1 TABLET BY MOUTH EVERYDAY AT BEDTIME  ? [DISCONTINUED] topiramate (TOPAMAX) 50 MG tablet TAKE  1.5 TABLETS BY MOUTH 2 TIMES DAILY.  ? [DISCONTINUED] beclomethasone (QVAR REDIHALER) 40 MCG/ACT inhaler Inhale 1 puff into the lungs 2 (two) times daily. (Patient not taking: Reported on 12/04/2021)  ? [DISCONTINUED] fluticasone (FLONASE) 50 MCG/ACT nasal spray Place 1-2 sprays into both nostrils every other day. (Patient not taking: Reported on 12/04/2021)  ? [DISCONTINUED] rizatriptan (MAXALT-MLT) 10 MG disintegrating tablet Take 1 tablet (10 mg total) by mouth as needed for migraine. May repeat in 2 hours if needed (Patient not taking: Reported on 12/04/2021)  ? [DISCONTINUED] hydrocortisone-pramoxine (PROCTOFOAM-HC) rectal foam 1 applicator   ? ?No facility-administered medications prior to visit.  ? ? ?Review of Systems  ?Constitutional:  Negative for chills, fever, malaise/fatigue and weight loss.  ?HENT:  Negative for congestion, ear pain, hearing loss, sinus pain and sore throat.   ?Eyes:  Negative for blurred vision, photophobia and pain.  ?Respiratory:  Negative for cough, shortness of breath and wheezing.   ?Cardiovascular:  Negative for chest pain, palpitations and leg swelling.  ?Gastrointestinal:  Negative for abdominal pain, constipation, diarrhea, heartburn, nausea and vomiting.  ?Genitourinary:  Negative for dysuria, frequency and urgency.  ?Musculoskeletal:  Negative for falls and neck pain.  ?Skin:  Negative for itching and rash.  ?Neurological:  Negative for dizziness, weakness and headaches.  ?Endo/Heme/Allergies:  Negative for polydipsia. Does not bruise/bleed easily.  ?Psychiatric/Behavioral:  Negative for depression, substance abuse  and suicidal ideas. The patient is not nervous/anxious and does not have insomnia.   ?   ?Objective:  ? ?  ?BP 119/79 (BP Location: Left Arm, Cuff Size: Normal)   Pulse 94   Resp 20   Ht 5' 5"  (1.651 m)   Wt 149 lb 11.2 oz (67.9 kg)   SpO2 100%   BMI 24.91 kg/m?  ? ? ?Physical Exam ?Constitutional:   ?   General: She is not in acute distress. ?   Appearance:  Normal appearance. She is not ill-appearing.  ?HENT:  ?   Head: Normocephalic and atraumatic.  ?   Right Ear: Tympanic membrane normal.  ?   Left Ear: Tympanic membrane normal.  ?   Nose: Nose normal.  ?   Mouth/Throat:  ?   Mouth: Mucous membranes are moist.  ?   Pharynx: No oropharyngeal exudate or posterior oropharyngeal erythema.  ?Eyes:  ?   Extraocular Movements: Extraocular movements intact.  ?   Conjunctiva/sclera: Conjunctivae normal.  ?   Pupils: Pupils are equal, round, and reactive to light.  ?Neck:  ?   Thyroid: No thyromegaly.  ?   Vascular: No carotid bruit or JVD.  ?   Trachea: Trachea normal.  ?Cardiovascular:  ?   Rate and Rhythm: Normal rate and regular rhythm.  ?   Pulses: Normal pulses.  ?   Heart sounds: Normal heart sounds. No murmur heard. ?  No friction rub. No gallop.  ?Pulmonary:  ?   Effort: Pulmonary effort is normal. No respiratory distress.  ?   Breath sounds: Normal breath sounds. No wheezing.  ?Abdominal:  ?   General: Bowel sounds are normal. There is no distension.  ?   Palpations: Abdomen is soft.  ?   Tenderness: There is no abdominal tenderness. There is no guarding.  ?Musculoskeletal:     ?   General: Normal range of motion.  ?   Cervical back: Normal range of motion and neck supple.  ?Skin: ?   General: Skin is warm and dry.  ?Neurological:  ?   Mental Status: She is alert and oriented to person, place, and time.  ?   Cranial Nerves: No cranial nerve deficit.  ?Psychiatric:     ?   Mood and Affect: Mood normal.     ?   Behavior: Behavior normal.     ?   Thought Content: Thought content normal.     ?   Judgment: Judgment normal.  ?  ? ?No results found for any visits on 02/05/22. ? ?   ?Assessment & Plan:  ?  ?Routine Health Maintenance and Physical Exam ? ?Immunization History  ?Administered Date(s) Administered  ? Influenza, Seasonal, Injecte, Preservative Fre 10/02/2013  ? Influenza,inj,Quad PF,6+ Mos 06/10/2016, 08/11/2017, 09/13/2019, 09/16/2020  ? Influenza-Unspecified  10/01/2011, 07/21/2012, 08/18/2012, 10/02/2013  ? PFIZER(Purple Top)SARS-COV-2 Vaccination 01/26/2020, 02/19/2020  ? Pneumococcal Polysaccharide-23 08/18/2012  ? Tdap 06/10/2016  ? ? ?Health Maintenance  ?Topic Date Due  ? COLONOSCOPY (Pts 45-1yr Insurance coverage will need to be confirmed)  Never done  ? INFLUENZA VACCINE  05/19/2022  ? TETANUS/TDAP  06/10/2026  ? PAP SMEAR-Modifier  12/04/2026  ? HIV Screening  Completed  ? HPV VACCINES  Aged Out  ? COVID-19 Vaccine  Discontinued  ? Hepatitis C Screening  Discontinued  ? ? ?Discussed health benefits of physical activity, and encouraged her to engage in regular exercise appropriate for her age and condition. ? ?Problem List Items Addressed This Visit   ? ?  ?  Respiratory  ? Asthma, chronic, mild persistent, uncomplicated (Chronic)  ?  Continue current regimen of daily oral OTC antihistamine, Singulair 68m nightly, QVar inhaler twice daily, and prn Albuterol inhaler.  ? ?  ?  ? Relevant Medications  ? albuterol (VENTOLIN HFA) 108 (90 Base) MCG/ACT inhaler  ? beclomethasone (QVAR REDIHALER) 40 MCG/ACT inhaler  ? montelukast (SINGULAIR) 10 MG tablet  ?  ? Other  ? History of migraine  ?  Stable.  Continue Topamax 50 mg twice daily. ? ?  ?  ? Relevant Medications  ? topiramate (TOPAMAX) 50 MG tablet  ? Colon cancer screening  ?  Cologuard ordered and completed, result available until the labs tab in epic. ? ?  ?  ? Elevated cholesterol with high triglycerides  ?  Checking lipids today.  ? ?  ?  ? Annual physical exam - Primary  ?  Checking CBC, CMP, and lipid panel today.  Up-to-date on preventative care.  Wellness information provided with AVS. ? ?  ?  ? Relevant Orders  ? CBC with Differential/Platelet  ? COMPLETE METABOLIC PANEL WITH GFR  ? Lipid panel  ? ?Other Visit Diagnoses   ? ? Encounter to establish care      ? ?  ? ?Return in about 1 year (around 02/06/2023) for annual physical exam or sooner if needed. ? ___________________________________________ ?JClearnce Sorrel DNP, APRN, FNP-BC ?Primary Care and Sports Medicine ?[place]? ? ? ?

## 2022-02-05 ENCOUNTER — Ambulatory Visit (INDEPENDENT_AMBULATORY_CARE_PROVIDER_SITE_OTHER): Payer: BC Managed Care – PPO | Admitting: Medical-Surgical

## 2022-02-05 ENCOUNTER — Encounter: Payer: Self-pay | Admitting: Medical-Surgical

## 2022-02-05 VITALS — BP 119/79 | HR 94 | Resp 20 | Ht 65.0 in | Wt 149.7 lb

## 2022-02-05 DIAGNOSIS — J4530 Mild persistent asthma, uncomplicated: Secondary | ICD-10-CM | POA: Diagnosis not present

## 2022-02-05 DIAGNOSIS — Z0000 Encounter for general adult medical examination without abnormal findings: Secondary | ICD-10-CM

## 2022-02-05 DIAGNOSIS — E782 Mixed hyperlipidemia: Secondary | ICD-10-CM | POA: Diagnosis not present

## 2022-02-05 DIAGNOSIS — Z7689 Persons encountering health services in other specified circumstances: Secondary | ICD-10-CM | POA: Diagnosis not present

## 2022-02-05 DIAGNOSIS — Z8669 Personal history of other diseases of the nervous system and sense organs: Secondary | ICD-10-CM

## 2022-02-05 DIAGNOSIS — Z1211 Encounter for screening for malignant neoplasm of colon: Secondary | ICD-10-CM

## 2022-02-05 DIAGNOSIS — J4520 Mild intermittent asthma, uncomplicated: Secondary | ICD-10-CM

## 2022-02-05 MED ORDER — ALBUTEROL SULFATE HFA 108 (90 BASE) MCG/ACT IN AERS: 1.0000 | INHALATION_SPRAY | RESPIRATORY_TRACT | 11 refills | Status: DC | PRN

## 2022-02-05 MED ORDER — TOPIRAMATE 50 MG PO TABS: ORAL_TABLET | ORAL | 1 refills | Status: DC

## 2022-02-05 MED ORDER — QVAR REDIHALER 40 MCG/ACT IN AERB: 1.0000 | INHALATION_SPRAY | Freq: Two times a day (BID) | RESPIRATORY_TRACT | 11 refills | Status: DC

## 2022-02-05 MED ORDER — MONTELUKAST SODIUM 10 MG PO TABS: ORAL_TABLET | ORAL | 1 refills | Status: DC

## 2022-02-05 MED ORDER — MELOXICAM 15 MG PO TABS: 7.5000 mg | ORAL_TABLET | Freq: Every day | ORAL | 1 refills | Status: DC

## 2022-02-05 NOTE — Assessment & Plan Note (Signed)
Cologuard ordered and completed, result available until the labs tab in epic. ?

## 2022-02-05 NOTE — Assessment & Plan Note (Signed)
Checking lipids today.  ?

## 2022-02-05 NOTE — Assessment & Plan Note (Addendum)
Stable.  Continue Topamax 50 mg twice daily. ?

## 2022-02-05 NOTE — Assessment & Plan Note (Signed)
Checking CBC, CMP, and lipid panel today.  Up-to-date on preventative care.  Wellness information provided with AVS. ?

## 2022-02-05 NOTE — Assessment & Plan Note (Signed)
Continue current regimen of daily oral OTC antihistamine, Singulair 4m nightly, QVar inhaler twice daily, and prn Albuterol inhaler.  ?

## 2022-02-06 DIAGNOSIS — Z0000 Encounter for general adult medical examination without abnormal findings: Secondary | ICD-10-CM | POA: Diagnosis not present

## 2022-02-06 LAB — CBC WITH DIFFERENTIAL/PLATELET
Absolute Monocytes: 536 cells/uL (ref 200–950)
Basophils Absolute: 82 cells/uL (ref 0–200)
Basophils Relative: 1.3 %
Eosinophils Absolute: 536 cells/uL — ABNORMAL HIGH (ref 15–500)
Eosinophils Relative: 8.5 %
HCT: 43.6 % (ref 35.0–45.0)
Hemoglobin: 14.3 g/dL (ref 11.7–15.5)
Lymphs Abs: 2942 cells/uL (ref 850–3900)
MCH: 29.7 pg (ref 27.0–33.0)
MCHC: 32.8 g/dL (ref 32.0–36.0)
MCV: 90.5 fL (ref 80.0–100.0)
MPV: 10.5 fL (ref 7.5–12.5)
Monocytes Relative: 8.5 %
Neutro Abs: 2205 cells/uL (ref 1500–7800)
Neutrophils Relative %: 35 %
Platelets: 301 10*3/uL (ref 140–400)
RBC: 4.82 10*6/uL (ref 3.80–5.10)
RDW: 12 % (ref 11.0–15.0)
Total Lymphocyte: 46.7 %
WBC: 6.3 10*3/uL (ref 3.8–10.8)

## 2022-02-06 LAB — LIPID PANEL
Cholesterol: 185 mg/dL (ref <200)
HDL: 48 mg/dL — ABNORMAL LOW (ref >=50)
LDL Cholesterol (Calc): 117 mg/dL (calc) — ABNORMAL HIGH
Non-HDL Cholesterol (Calc): 137 mg/dL (calc) — ABNORMAL HIGH (ref <130)
Total CHOL/HDL Ratio: 3.9 (calc) (ref <5.0)
Triglycerides: 102 mg/dL (ref <150)

## 2022-02-06 LAB — COMPLETE METABOLIC PANEL WITH GFR
AG Ratio: 1.7 (calc) (ref 1.0–2.5)
ALT: 19 U/L (ref 6–29)
AST: 16 U/L (ref 10–35)
Albumin: 4.3 g/dL (ref 3.6–5.1)
Alkaline phosphatase (APISO): 41 U/L (ref 31–125)
BUN/Creatinine Ratio: 13 (calc) (ref 6–22)
BUN: 15 mg/dL (ref 7–25)
CO2: 27 mmol/L (ref 20–32)
Calcium: 9.8 mg/dL (ref 8.6–10.2)
Chloride: 106 mmol/L (ref 98–110)
Creat: 1.12 mg/dL — ABNORMAL HIGH (ref 0.50–0.99)
Globulin: 2.5 g/dL (calc) (ref 1.9–3.7)
Glucose, Bld: 91 mg/dL (ref 65–99)
Potassium: 4.6 mmol/L (ref 3.5–5.3)
Sodium: 138 mmol/L (ref 135–146)
Total Bilirubin: 0.6 mg/dL (ref 0.2–1.2)
Total Protein: 6.8 g/dL (ref 6.1–8.1)
eGFR: 62 mL/min/{1.73_m2} (ref >=60)

## 2022-07-30 ENCOUNTER — Other Ambulatory Visit: Payer: Self-pay | Admitting: Medical-Surgical

## 2022-07-30 DIAGNOSIS — J4530 Mild persistent asthma, uncomplicated: Secondary | ICD-10-CM

## 2022-08-05 ENCOUNTER — Other Ambulatory Visit: Payer: Self-pay | Admitting: Medical-Surgical

## 2023-01-10 ENCOUNTER — Other Ambulatory Visit: Payer: Self-pay | Admitting: Medical-Surgical

## 2023-01-10 DIAGNOSIS — Z8669 Personal history of other diseases of the nervous system and sense organs: Secondary | ICD-10-CM

## 2023-01-27 ENCOUNTER — Encounter: Payer: Self-pay | Admitting: Obstetrics and Gynecology

## 2023-01-27 ENCOUNTER — Ambulatory Visit (INDEPENDENT_AMBULATORY_CARE_PROVIDER_SITE_OTHER): Payer: BC Managed Care – PPO | Admitting: Obstetrics and Gynecology

## 2023-01-27 VITALS — BP 110/67 | HR 103 | Resp 16 | Ht 65.0 in | Wt 157.0 lb

## 2023-01-27 DIAGNOSIS — Z3202 Encounter for pregnancy test, result negative: Secondary | ICD-10-CM

## 2023-01-27 DIAGNOSIS — Z30430 Encounter for insertion of intrauterine contraceptive device: Secondary | ICD-10-CM | POA: Diagnosis not present

## 2023-01-27 LAB — POCT URINE PREGNANCY: Preg Test, Ur: NEGATIVE

## 2023-01-27 MED ORDER — LEVONORGESTREL 20 MCG/DAY IU IUD
1.0000 | INTRAUTERINE_SYSTEM | Freq: Once | INTRAUTERINE | Status: AC
  Administered 2023-01-27: 1 via INTRAUTERINE

## 2023-01-27 NOTE — Progress Notes (Signed)
    GYNECOLOGY OFFICE PROCEDURE NOTE  Candice Hanson is a 47 y.o. G3P3000 here for IUD insertion. No GYN concerns.  Last pap smear was on 12/04/21 and was normal.  IUD Removal & Insertion Procedure Note Procedure: IUD insertion with Mirena UPT: Negative  Patient identified.  Risks, benefits and alternatives of procedure were discussed including irregular bleeding, cramping, infection, malpositioning or misplacement of the IUD outside the uterus which may require further procedure such as laparoscopy. Also discussed >99% contraception efficacy, increased risk of ectopic pregnancy with failure of method.  Consent signed. Time out performed.   Speculum inserted and cervix visualized. The strings of the IUD were grasped and pulled using ring forceps. The IUD was successfully removed in its entirety.   Cervix prepped with 3 swabs of betadine.   The cervix was grasped with a single tooth tenaculum. Attempted to pass IUD without success. Cervical os finders used to progressively dilate the cervix. Was still unable to pass the IUD. Transabdominal US performed for guidance. While watching on ultrasound, IUD inserter was able to be passed through the os and uterus sounded to 7cm. IUD was then inserted per manufacturer's instructions. Ultrasound confirmed appropriate placement at the fundus. Strings trimmed to 3cm.  All instruments were removed. Patient tolerated procedure well.   Discussed concerning signs/symptoms and to call if heavy bleeding, severe abdominal pain, or fever in the following 3 weeks. Manufacturer pamphlet/patient information given. Reviewed timing of efficacy for contraception and to use an alternative form of birth control until that time.  Harvie Bridge, MD Obstetrician & Gynecologist, [facility] for [employer], [facility]

## 2023-02-10 ENCOUNTER — Other Ambulatory Visit: Payer: Self-pay | Admitting: Medical-Surgical

## 2023-02-10 ENCOUNTER — Telehealth (INDEPENDENT_AMBULATORY_CARE_PROVIDER_SITE_OTHER): Payer: BC Managed Care – PPO | Admitting: Obstetrics and Gynecology

## 2023-02-10 DIAGNOSIS — J4530 Mild persistent asthma, uncomplicated: Secondary | ICD-10-CM

## 2023-02-10 DIAGNOSIS — R232 Flushing: Secondary | ICD-10-CM | POA: Diagnosis not present

## 2023-02-10 MED ORDER — ESTRADIOL 0.025 MG/24HR TD PTWK: 0.0250 mg | MEDICATED_PATCH | TRANSDERMAL | 12 refills | Status: DC

## 2023-02-10 NOTE — Progress Notes (Signed)
   RETURN GYNECOLOGY VISIT  Subjective:  Candice Hanson is a 47 y.o. G3P3 with mIUD in place presenting for video visit to discuss hormone therapy.   Reports hot flashes esp at night that have felt "insane" for the past year. Disrupting sleep. Brain fog and low mood the next day due to lack of sleep.  General moodiness/mood swings - irritable, anxious. Dealing w/ her anxiety in other ways, too.  Has hx migraine with aura so unable to receive COCs.  Objective:  There were no vitals filed for this visit.  General:  Alert, oriented and cooperative. Patient is in no acute distress.  Skin: Skin is warm and dry. No rash noted.   Cardiovascular: Normal heart rate noted  Respiratory: Normal respiratory effort, no problems with respiration noted  Abdomen: Soft, non-tender, non-distended   Pelvic: NEFG.   Exam performed in the presence of a chaperone  Assessment and Plan:  Candice Hanson is a 47 y.o. with hot flashes  Hot flashes - Discussed that this is likely related to menopausal transition with average age of menopause between 30-80 years old - Reviewed that estrogen therapy is the most effective treatment for moderate/severe hot flashes, but that non-hormonal options are available. - Discussed goals of therapy i.e. reduction of hot flashes (not complete resolution). Reviewed full response takes up to 2-3 months, including for hormone therapy. - Discussed if HRT we do shortest amount of time at lowest dose. Reviewed that dosing for her may be tricky in this perimenopausal period. She is not eligible for COCs given migraines with aura but menopause dosing may not be effective. Regardless, we will start at lowest dose for menopause and titrate as needed. - We discussed the differences in modes of therapy for HRT -- patch vs oral therapy. Recommended patch due to theoretical benefit of avoiding hepatic metabolism  - Reviewed need for estrogen AND progesterone. Discussed that LNG-IUD would be  considered off label use but reasonable given that she still needs effective contraception - Discussed risks of hormone therapy in her age group appear to be limited to VTE. Discussed potential risks for stroke, cardiovascular disease, breast cancer. - After discussion, Charisa would like to move forward with estrogen patches and titrate prn.  Return in about 2 months (around 04/12/2023) for return gyn to follow up response to estrogen patches.  Future Appointments  Date Time Provider Department Center  02/11/2023  3:20 PM Christen Butter, NP [facility] None   Lennart Pall, MD

## 2023-02-11 ENCOUNTER — Ambulatory Visit (INDEPENDENT_AMBULATORY_CARE_PROVIDER_SITE_OTHER): Payer: BC Managed Care – PPO | Admitting: Medical-Surgical

## 2023-02-11 ENCOUNTER — Encounter: Payer: Self-pay | Admitting: Medical-Surgical

## 2023-02-11 VITALS — BP 118/79 | HR 78 | Resp 20 | Ht 65.0 in | Wt 156.1 lb

## 2023-02-11 DIAGNOSIS — J4530 Mild persistent asthma, uncomplicated: Secondary | ICD-10-CM

## 2023-02-11 DIAGNOSIS — Z1211 Encounter for screening for malignant neoplasm of colon: Secondary | ICD-10-CM | POA: Diagnosis not present

## 2023-02-11 DIAGNOSIS — Z0000 Encounter for general adult medical examination without abnormal findings: Secondary | ICD-10-CM | POA: Diagnosis not present

## 2023-02-11 DIAGNOSIS — Z13220 Encounter for screening for lipoid disorders: Secondary | ICD-10-CM

## 2023-02-11 DIAGNOSIS — Z8669 Personal history of other diseases of the nervous system and sense organs: Secondary | ICD-10-CM | POA: Diagnosis not present

## 2023-02-11 MED ORDER — MELOXICAM 15 MG PO TABS: ORAL_TABLET | ORAL | 3 refills | Status: DC

## 2023-02-11 MED ORDER — MONTELUKAST SODIUM 10 MG PO TABS
ORAL_TABLET | ORAL | 3 refills | Status: DC
Start: 2023-02-11 — End: 2024-02-13

## 2023-02-11 MED ORDER — ALBUTEROL SULFATE HFA 108 (90 BASE) MCG/ACT IN AERS: 1.0000 | INHALATION_SPRAY | RESPIRATORY_TRACT | 11 refills | Status: DC | PRN

## 2023-02-11 MED ORDER — QVAR REDIHALER 40 MCG/ACT IN AERB: 1.0000 | INHALATION_SPRAY | Freq: Two times a day (BID) | RESPIRATORY_TRACT | 11 refills | Status: DC

## 2023-02-11 MED ORDER — SUMATRIPTAN SUCCINATE 50 MG PO TABS: 50.0000 mg | ORAL_TABLET | ORAL | 11 refills | Status: DC | PRN

## 2023-02-11 MED ORDER — TOPIRAMATE 50 MG PO TABS
75.0000 mg | ORAL_TABLET | Freq: Two times a day (BID) | ORAL | 3 refills | Status: DC
Start: 2023-02-11 — End: 2024-02-13

## 2023-02-11 NOTE — Progress Notes (Signed)
Complete physical exam  Patient: Candice Hanson   DOB: [birth year]   47 y.o. Female  MRN: [id number]  Subjective:    Chief Complaint  Patient presents with   Annual Exam    Sai Moura is a 47 y.o. female who presents today for a complete physical exam. She reports consuming a general diet.  Weight lifting, strength training, starting [employer].  She generally feels well. She reports sleeping poorly. She does not have additional problems to discuss today.    Most recent fall risk assessment:    02/11/2023    3:48 PM  Fall Risk   Falls in the past year? 0  Number falls in past yr: 0  Injury with Fall? 0  Risk for fall due to : No Fall Risks  Follow up Falls evaluation completed     Most recent depression screenings:    02/11/2023    3:48 PM 02/05/2022   11:03 AM  PHQ 2/9 Scores  PHQ - 2 Score 0 0    Vision:Within last year and Dental: No current dental problems and Receives regular dental care    Patient Care Team: Christen Butter, NP as PCP - General (Nurse Practitioner)   Outpatient Medications Prior to Visit  Medication Sig   cetirizine (ZYRTEC) 10 MG chewable tablet Chew 10 mg by mouth daily.   estradiol (CLIMARA - DOSED IN MG/24 HR) 0.025 mg/24hr patch Place 1 patch (0.025 mg total) onto the skin once a week.   fexofenadine (ALLEGRA ODT) 30 MG disintegrating tablet Take 30 mg by mouth daily.   levonorgestrel (MIRENA) 20 MCG/24HR IUD 1 each by Intrauterine route once.   Multiple Vitamins-Minerals (MULTIVITAMIN ADULT PO) Take by mouth.   Turmeric 500 MG CAPS    [DISCONTINUED] albuterol (VENTOLIN HFA) 108 (90 Base) MCG/ACT inhaler Inhale 1-2 puffs into the lungs every 4 (four) hours as needed for wheezing or shortness of breath.   [DISCONTINUED] beclomethasone (QVAR REDIHALER) 40 MCG/ACT inhaler Inhale 1 puff into the lungs 2 (two) times daily.   [DISCONTINUED] meloxicam (MOBIC) 15 MG tablet TAKE 0.5-1 TABLETS (7.5-15 MG TOTAL) BY MOUTH DAILY. AS NEEDED FOR ACHES/PAINS    [DISCONTINUED] montelukast (SINGULAIR) 10 MG tablet TAKE 1 TABLET BY MOUTH EVERYDAY AT BEDTIME. NO REFILLS. DUE FOR YRLY EXAM.   [DISCONTINUED] topiramate (TOPAMAX) 50 MG tablet TAKE 1.5 TABLETS BY MOUTH 2 TIMES DAILY.   No facility-administered medications prior to visit.    Review of Systems  Constitutional:  Negative for chills, fever, malaise/fatigue and weight loss.       Hot flashes, night sweats  HENT:  Negative for congestion, ear pain, hearing loss, sinus pain and sore throat.   Eyes:  Negative for blurred vision, photophobia and pain.  Respiratory:  Negative for cough, shortness of breath and wheezing.   Cardiovascular:  Negative for chest pain, palpitations and leg swelling.  Gastrointestinal:  Negative for abdominal pain, constipation, diarrhea, heartburn, nausea and vomiting.  Genitourinary:  Negative for dysuria, frequency and urgency.  Musculoskeletal:  Negative for falls and neck pain.  Skin:  Negative for itching and rash.  Neurological:  Negative for dizziness, weakness and headaches.  Endo/Heme/Allergies:  Negative for polydipsia. Does not bruise/bleed easily.  Psychiatric/Behavioral:  Negative for depression, substance abuse and suicidal ideas. The patient is nervous/anxious and has insomnia.      Objective:    BP 118/79 (BP Location: Right Arm, Cuff Size: Normal)   Pulse 78   Resp 20   Ht  (1.651 m)  Wt 156 lb 1.9 oz (70.8 kg)   SpO2 100%   BMI 25.98 kg/m    Physical Exam Constitutional:      General: She is not in acute distress.    Appearance: Normal appearance. She is not ill-appearing.  HENT:     Head: Normocephalic and atraumatic.     Right Ear: Tympanic membrane, ear canal and external ear normal. There is no impacted cerumen.     Left Ear: Tympanic membrane, ear canal and external ear normal. There is no impacted cerumen.     Nose: Nose normal. No congestion or rhinorrhea.     Mouth/Throat:     Mouth: Mucous membranes are moist.      Pharynx: No oropharyngeal exudate or posterior oropharyngeal erythema.  Eyes:     General: No scleral icterus.       Right eye: No discharge.        Left eye: No discharge.     Extraocular Movements: Extraocular movements intact.     Conjunctiva/sclera: Conjunctivae normal.     Pupils: Pupils are equal, round, and reactive to light.  Neck:     Thyroid: No thyromegaly.     Vascular: No carotid bruit or JVD.     Trachea: Trachea normal.  Cardiovascular:     Rate and Rhythm: Normal rate and regular rhythm.     Pulses: Normal pulses.     Heart sounds: Normal heart sounds. No murmur heard.    No friction rub. No gallop.  Pulmonary:     Effort: Pulmonary effort is normal. No respiratory distress.     Breath sounds: Normal breath sounds. No wheezing.  Abdominal:     General: Bowel sounds are normal. There is no distension.     Palpations: Abdomen is soft.     Tenderness: There is no abdominal tenderness. There is no guarding.  Musculoskeletal:        General: Normal range of motion.     Cervical back: Normal range of motion and neck supple.  Lymphadenopathy:     Cervical: No cervical adenopathy.  Skin:    General: Skin is warm and dry.  Neurological:     Mental Status: She is alert and oriented to person, place, and time.     Cranial Nerves: No cranial nerve deficit.  Psychiatric:        Mood and Affect: Mood normal.        Behavior: Behavior normal.        Thought Content: Thought content normal.        Judgment: Judgment normal.      No results found for any visits on 02/11/23.     Assessment & Plan:    Routine Health Maintenance and Physical Exam  Immunization History  Administered Date(s) Administered   Influenza, Seasonal, Injecte, Preservative Fre 10/02/2013   Influenza,inj,Quad PF,6+ Mos 06/10/2016, 08/11/2017, 09/13/2019, 09/16/2020   Influenza-Unspecified 10/01/2011, 07/21/2012, 08/18/2012, 10/02/2013   PFIZER(Purple Top)SARS-COV-2 Vaccination 01/26/2020,  02/19/2020   Pneumococcal Polysaccharide-23 08/18/2012   Tdap 06/10/2016    Health Maintenance  Topic Date Due   INFLUENZA VACCINE  05/20/2023   Fecal DNA (Cologuard)  12/30/2024   DTaP/Tdap/Td (2 - Td or Tdap) 06/10/2026   PAP SMEAR-Modifier  12/04/2026   HIV Screening  Completed   HPV VACCINES  Aged Out   COVID-19 Vaccine  Discontinued   Hepatitis C Screening  Discontinued    Discussed health benefits of physical activity, and encouraged her to engage in regular exercise appropriate for  her age and condition.  1. Asthma, chronic, mild persistent, uncomplicated Currently well-managed on current regimen.  Continue Singulair 10 mg nightly.  Continue Qvar and albuterol inhalers as prescribed. - albuterol (VENTOLIN HFA) 108 (90 Base) MCG/ACT inhaler; Inhale 1-2 puffs into the lungs every 4 (four) hours as needed for wheezing or shortness of breath.  Dispense: 8.5 each; Refill: 11 - montelukast (SINGULAIR) 10 MG tablet; TAKE 1 TABLET BY MOUTH EVERYDAY AT BEDTIME.  Dispense: 90 tablet; Refill: 3  2. History of migraine Well-managed on current regimen.  Continue Topamax 75 mg twice daily. - topiramate (TOPAMAX) 50 MG tablet; Take 1.5 tablets (75 mg total) by mouth 2 (two) times daily.  Dispense: 270 tablet; Refill: 3  3. Annual physical exam Checking labs as below.  Up-to-date on preventative care.  Wellness information provided with AVS.  Well woman care provided by OB/GYN. - CBC with Differential/Platelet - COMPLETE METABOLIC PANEL WITH GFR - Lipid panel  4. Colon cancer screening Cologuard completed 12/2021.  Not due again until 12/2024.  5. Lipid screening Checking lipid panel today. - Lipid panel  Return in about 1 year (around 02/11/2024) for annual physical exam or sooner if needed.   Christen Butter, NP

## 2023-02-12 DIAGNOSIS — Z13220 Encounter for screening for lipoid disorders: Secondary | ICD-10-CM | POA: Diagnosis not present

## 2023-02-12 DIAGNOSIS — Z0000 Encounter for general adult medical examination without abnormal findings: Secondary | ICD-10-CM | POA: Diagnosis not present

## 2023-02-12 LAB — CBC WITH DIFFERENTIAL/PLATELET
Absolute Monocytes: 589 cells/uL (ref 200–950)
Basophils Absolute: 143 cells/uL (ref 0–200)
Basophils Relative: 1.5 %
Eosinophils Absolute: 2470 cells/uL — ABNORMAL HIGH (ref 15–500)
Eosinophils Relative: 26 %
HCT: 40.7 % (ref 35.0–45.0)
Hemoglobin: 13.7 g/dL (ref 11.7–15.5)
Lymphs Abs: 3221 cells/uL (ref 850–3900)
MCH: 29.4 pg (ref 27.0–33.0)
MCHC: 33.7 g/dL (ref 32.0–36.0)
MCV: 87.3 fL (ref 80.0–100.0)
MPV: 10.6 fL (ref 7.5–12.5)
Monocytes Relative: 6.2 %
Neutro Abs: 3078 cells/uL (ref 1500–7800)
Neutrophils Relative %: 32.4 %
Platelets: 280 10*3/uL (ref 140–400)
RBC: 4.66 10*6/uL (ref 3.80–5.10)
RDW: 11.8 % (ref 11.0–15.0)
Total Lymphocyte: 33.9 %
WBC: 9.5 10*3/uL (ref 3.8–10.8)

## 2023-02-12 LAB — COMPLETE METABOLIC PANEL WITH GFR
AG Ratio: 1.8 (calc) (ref 1.0–2.5)
ALT: 10 U/L (ref 6–29)
AST: 12 U/L (ref 10–35)
Albumin: 4.2 g/dL (ref 3.6–5.1)
Alkaline phosphatase (APISO): 42 U/L (ref 31–125)
BUN: 14 mg/dL (ref 7–25)
CO2: 25 mmol/L (ref 20–32)
Calcium: 9.5 mg/dL (ref 8.6–10.2)
Chloride: 107 mmol/L (ref 98–110)
Creat: 0.94 mg/dL (ref 0.50–0.99)
Globulin: 2.4 g/dL (calc) (ref 1.9–3.7)
Glucose, Bld: 91 mg/dL (ref 65–99)
Potassium: 4.4 mmol/L (ref 3.5–5.3)
Sodium: 139 mmol/L (ref 135–146)
Total Bilirubin: 0.5 mg/dL (ref 0.2–1.2)
Total Protein: 6.6 g/dL (ref 6.1–8.1)
eGFR: 76 mL/min/{1.73_m2} (ref >=60)

## 2023-02-12 LAB — LIPID PANEL
Cholesterol: 186 mg/dL (ref <200)
HDL: 46 mg/dL — ABNORMAL LOW (ref >=50)
LDL Cholesterol (Calc): 121 mg/dL (calc) — ABNORMAL HIGH
Non-HDL Cholesterol (Calc): 140 mg/dL (calc) — ABNORMAL HIGH (ref <130)
Total CHOL/HDL Ratio: 4 (calc) (ref <5.0)
Triglycerides: 86 mg/dL (ref <150)

## 2023-04-12 ENCOUNTER — Telehealth (INDEPENDENT_AMBULATORY_CARE_PROVIDER_SITE_OTHER): Payer: BC Managed Care – PPO | Admitting: Obstetrics and Gynecology

## 2023-04-12 DIAGNOSIS — N951 Menopausal and female climacteric states: Secondary | ICD-10-CM

## 2023-04-12 MED ORDER — ESTRADIOL 0.0375 MG/24HR TD PTWK: 0.0375 mg | MEDICATED_PATCH | TRANSDERMAL | 12 refills | Status: DC

## 2023-04-12 NOTE — Progress Notes (Signed)
   TELEHEALTH GYNECOLOGY VISIT ENCOUNTER NOTE  Provider location: [facility] [address]   Patient location: Car (passenger)  I connected with Candice Hanson on 04/12/23 at  3:30 PM EDT by MyChart audiovisual encounter.    History:  Candice Hanson is a 47 y.o. G3P3 with IUD in place presenting for surveillance of hormone therapy.   Last saw pt 4/24 - severe hot flashes, sleep disruption, brain fog and low mood due to lack of sleep. Opted to proceed with hormone therapy to address her vasomotor symptoms. She is not a candidate for COCs due to history of migraine with aura so we are doing estrogen patch with IUD for endometrial protection.   Today, reports significant improvement in her symptoms. Still having daily hot flashes, but not as severe. Feels like we are moving in the right direction, but does not have ideal control of her symptoms right now.      Past Medical History:  Diagnosis Date   Allergy    Asthma    Migraine headache    Past Surgical History:  Procedure Laterality Date   WISDOM TOOTH EXTRACTION     The following portions of the patient's history were reviewed and updated as appropriate: allergies, current medications, past family history, past medical history, past social history, past surgical history and problem list.   Health Maintenance:  Normal pap and negative HRHPV on 12/04/21.  Normal mammogram on 12/18/21.   Review of Systems:  Pertinent items noted in HPI and remainder of comprehensive ROS otherwise negative.  Physical Exam:   General:  Alert, oriented and cooperative.   Mental Status: Normal mood and affect perceived. Normal judgment and thought content.  Physical exam deferred due to nature of the encounter  Labs and Imaging No results found for this or any previous visit (from the past 336 hour(s)). No results found.    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 After  discussion, she would like to increase dose. Will f/u in 3 months for annual exam  Perimenopausal vasomotor symptoms -     estradiol (CLIMARA) 0.0375 mg/24hr patch; Place 1 patch (0.0375 mg total) onto the skin once a week. Continue IUD for endometrial protection & contraception  I discussed the assessment and treatment plan with the patient. The patient was provided an opportunity to ask questions and all were answered. The patient agreed with the plan and demonstrated an understanding of the instructions.  I provided 15 minutes of non-face-to-face time during this encounter.  Lennart Pall, MD [facility]

## 2023-07-08 ENCOUNTER — Other Ambulatory Visit: Payer: Self-pay | Admitting: Medical-Surgical

## 2023-07-08 DIAGNOSIS — J4530 Mild persistent asthma, uncomplicated: Secondary | ICD-10-CM

## 2023-07-19 ENCOUNTER — Ambulatory Visit: Payer: BC Managed Care – PPO | Admitting: Obstetrics & Gynecology

## 2023-07-19 ENCOUNTER — Encounter: Payer: Self-pay | Admitting: Obstetrics & Gynecology

## 2023-07-19 VITALS — BP 118/80 | HR 73 | Resp 16 | Ht 65.0 in | Wt 166.0 lb

## 2023-07-19 DIAGNOSIS — Z1231 Encounter for screening mammogram for malignant neoplasm of breast: Secondary | ICD-10-CM

## 2023-07-19 DIAGNOSIS — Z01419 Encounter for gynecological examination (general) (routine) without abnormal findings: Secondary | ICD-10-CM | POA: Diagnosis not present

## 2023-07-19 DIAGNOSIS — N951 Menopausal and female climacteric states: Secondary | ICD-10-CM

## 2023-07-19 MED ORDER — ESTRADIOL 0.0375 MG/24HR TD PTWK: 0.0375 mg | MEDICATED_PATCH | TRANSDERMAL | 12 refills | Status: DC

## 2023-07-19 NOTE — Progress Notes (Signed)
Subjective:     Candice Hanson is a 47 y.o. female here for a routine exam.  Current complaints: startign new job; doing better on Climera increased dose; IUD for endometrial protection. .    Gynecologic History No LMP recorded. (Menstrual status: IUD). Contraception: IUD Last Pap: 2023. Results were: normal Last mammogram: 2023. Results were: normal  Obstetric History OB History  Gravida Para Term Preterm AB Living  3 3 3         SAB IAB Ectopic Multiple Live Births               # Outcome Date GA Lbr Len/2nd Weight Sex Type Anes PTL Lv  3 Term      Vag-Spont     2 Term      Vag-Spont     1 Term      Vag-Spont        The following portions of the patient's history were reviewed and updated as appropriate: allergies, current medications, past family history, past medical history, past social history, past surgical history, and problem list.  Review of Systems Pertinent items noted in HPI and remainder of comprehensive ROS otherwise negative.    Objective:     Vitals:   07/19/23 1342  BP: 118/80  Pulse: 73  Resp: 16  Weight: 166 lb (75.3 kg)  Height: 5\' 5"  (1.651 m)   Vitals:  WNL General appearance: alert, cooperative and no distress  HEENT: Normocephalic, without obvious abnormality, atraumatic Eyes: negative Throat: lips, mucosa, and tongue normal; teeth and gums normal  Respiratory: Clear to auscultation bilaterally  CV: Regular rate and rhythm  Breasts:  Normal appearance, no masses or tenderness, no nipple retraction or dimpling  GI: Soft, non-tender; bowel sounds normal; no masses,  no organomegaly  GU: External Genitalia:  Tanner V, no lesion Urethra:  No prolapse   Vagina: Pink, normal rugae, no blood or discharge  Cervix: No CMT, no lesion, IUD strings seen   Uterus:  Normal size and contour, non tender  Adnexa: Normal, no masses, non tender  Musculoskeletal: No edema, redness or tenderness in the calves or thighs  Skin: No lesions or rash  Lymphatic:  Axillary adenopathy: none     Psychiatric: Normal mood and behavior        Assessment:    Healthy female exam.    Plan:    Pap smear up to date Mammogram ordered and yearly Cologuard last year; will do colonoscopy in 2 more years.  Calcium worksheet and reviewed 1200mg /800 international units  Refill Climara Continue IUD

## 2023-08-12 ENCOUNTER — Ambulatory Visit: Payer: BC Managed Care – PPO | Admitting: Obstetrics and Gynecology

## 2023-09-21 IMAGING — US US PELVIS COMPLETE WITH TRANSVAGINAL
1 series · 13 of 25 positions shown · non-contrast
Comparison: None

CLINICAL DATA: Menometrorrhagia lasting for 2 weeks, has Mirena
IUD, IUD placement



[Series 1: us pelvic complete with transvaginal · 13 of 111 slices shown]
[im 1/111]
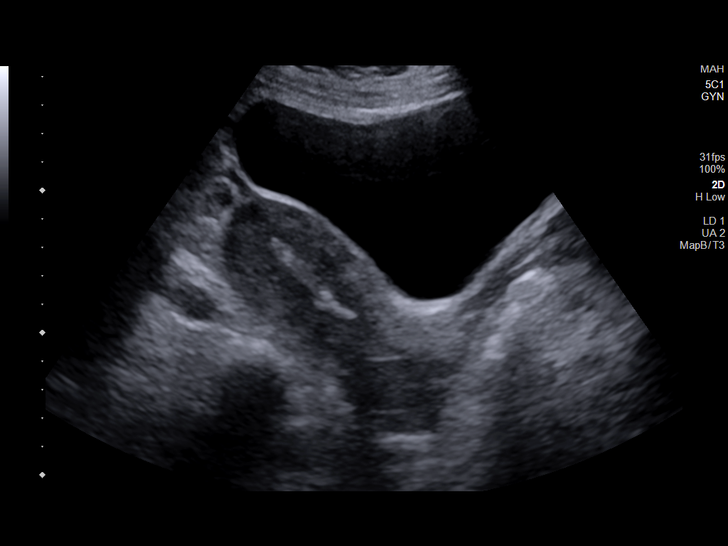
[im 10/111]
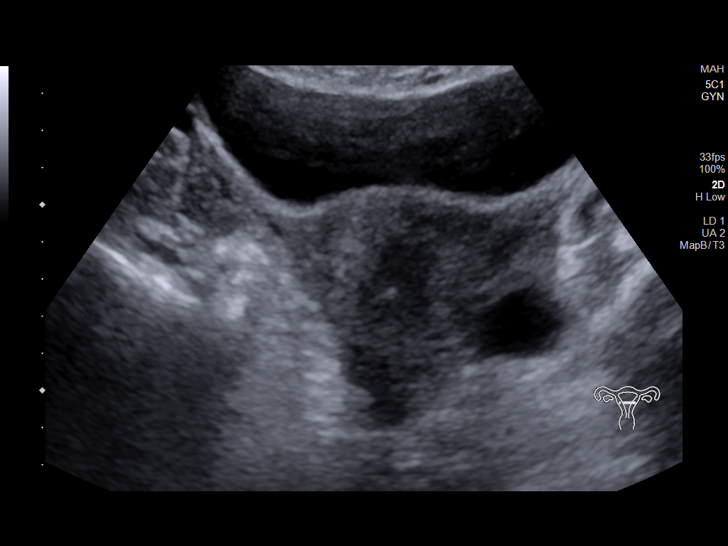
[im 19/111]
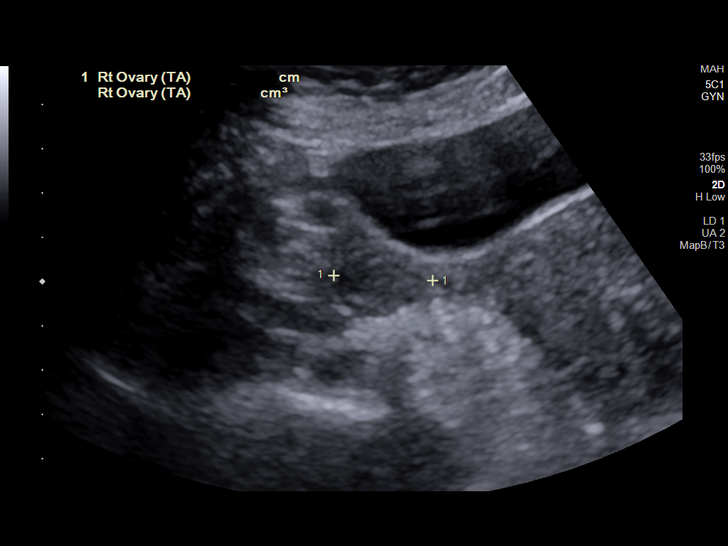
[im 28/111]
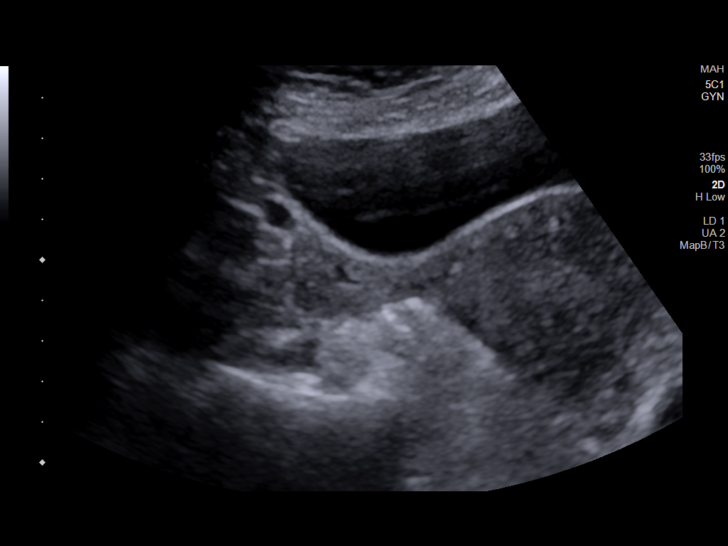
[im 37/111]
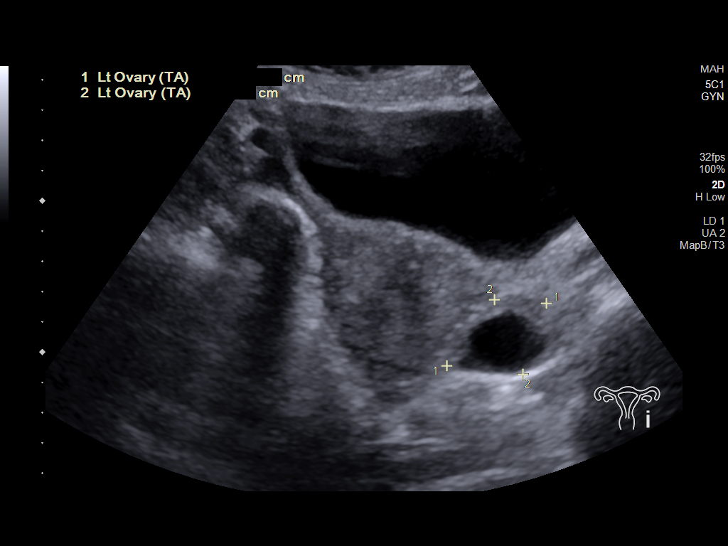
[im 46/111]
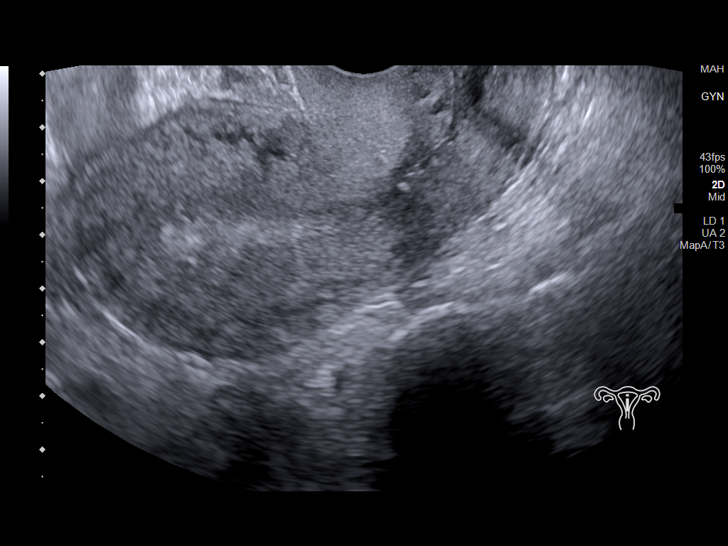
[im 56/111]
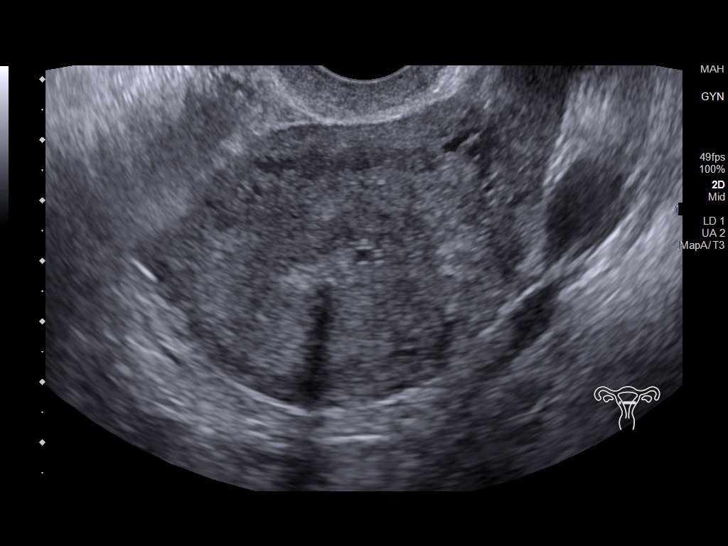
[im 65/111]
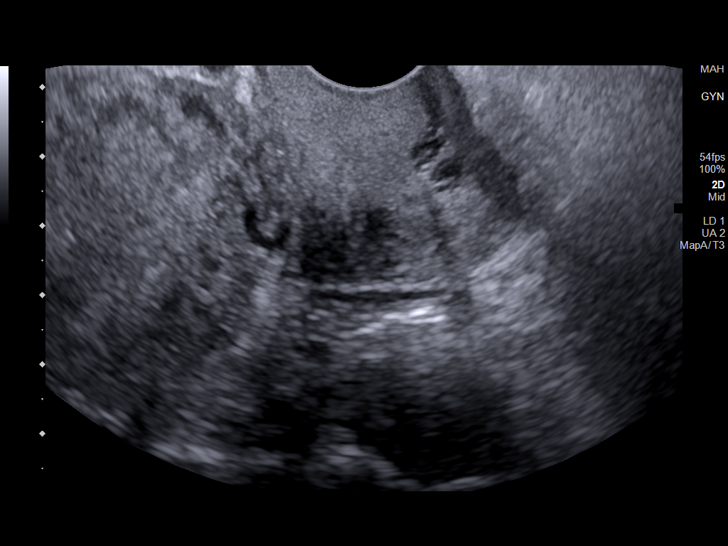
[im 74/111]
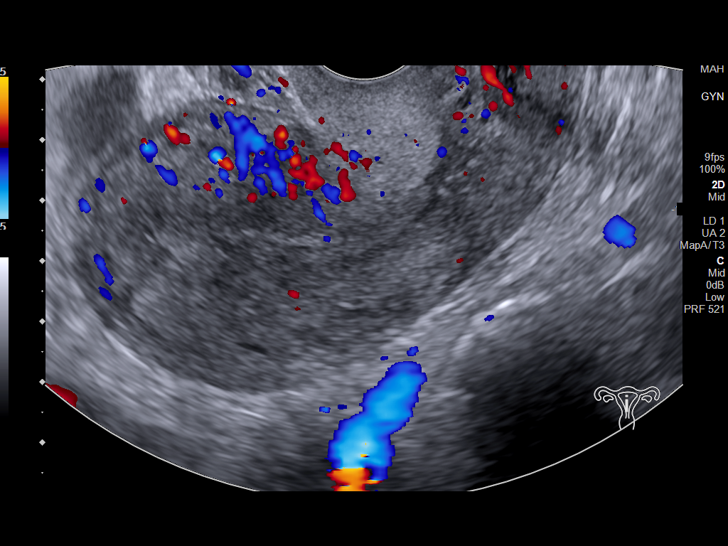
[im 83/111]
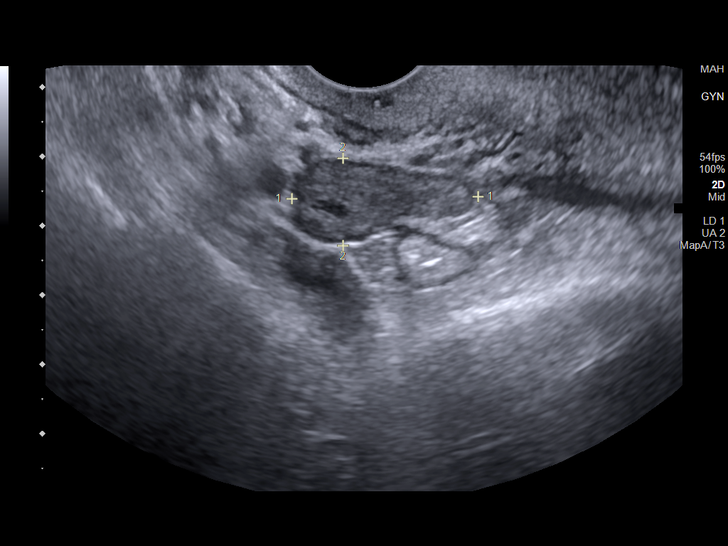
[im 92/111]
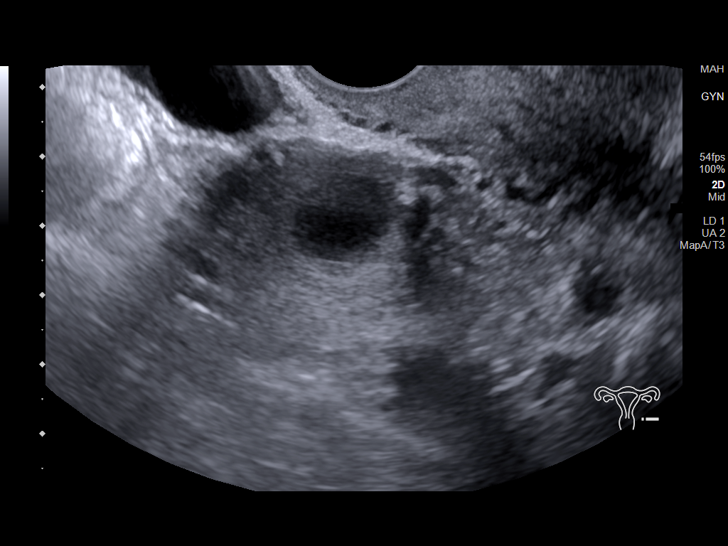
[im 101/111]
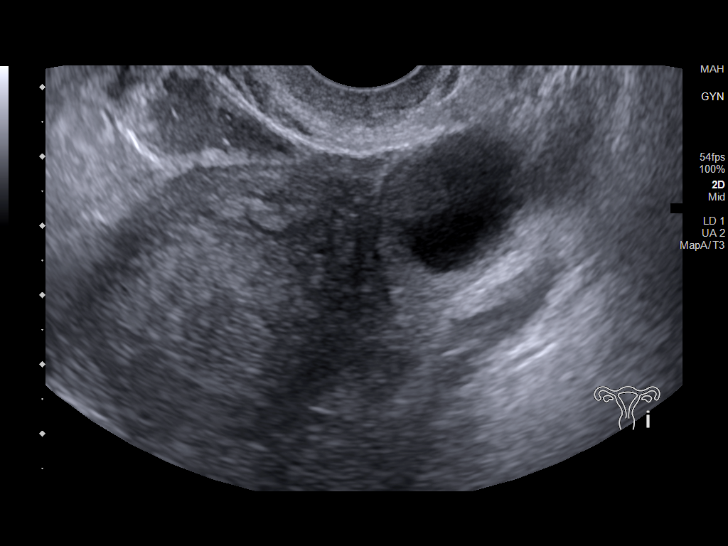
[im 111/111]
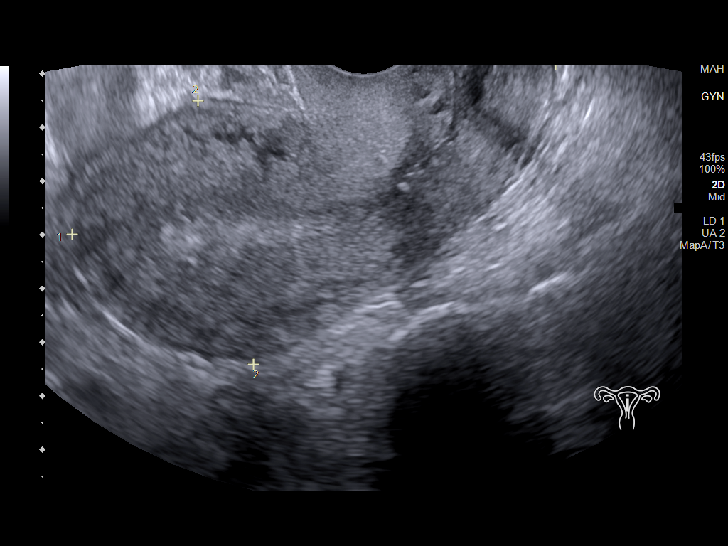

[13 of 25 positions shown; findings below may reference images not displayed]

FINDINGS: Uterus

Measurements: 10.3 x 4.2 x 6.6 cm = volume: 148 mL. Anteverted.
Normal morphology without mass. Nabothian cysts in cervix.

Endometrium

Thickness: 5 mm. IUD identified at upper uterine segment endometrial
canal, best visualized on the third cine series. IUD is less well
visualized than typically seen. No endometrial fluid or mass.

Right ovary

Measurements: 2.7 x 1.3 x 1.7 cm = volume: 3.0 mL. Normal morphology
without mass

Left ovary

Measurements: 3.2 x 2.4 x 2.9 cm = volume: 11.3 mL. Dominant
follicle 2.4 cm diameter; no follow-up imaging recommended. No
additional mass.

Other findings

Trace free pelvic fluid.  No adnexal masses.
IMPRESSION: No pelvic sonographic abnormalities identified.

IUD identified at upper uterine segment endometrial canal without
endometrial fluid or mass.

## 2023-10-03 ENCOUNTER — Telehealth: Payer: Commercial Managed Care - PPO | Admitting: Family

## 2023-10-03 DIAGNOSIS — J019 Acute sinusitis, unspecified: Secondary | ICD-10-CM

## 2023-10-03 MED ORDER — AMOXICILLIN-POT CLAVULANATE 875-125 MG PO TABS
1.0000 | ORAL_TABLET | Freq: Two times a day (BID) | ORAL | 0 refills | Status: DC
Start: 2023-10-03 — End: 2024-02-13

## 2023-10-03 NOTE — Progress Notes (Signed)
Virtual Visit Consent   Candice Hanson, you are scheduled for a virtual visit with a [facility] provider today. Just as with appointments in the office, your consent must be obtained to participate. Your consent will be active for this visit and any virtual visit you may have with one of our providers in the next 365 days. If you have a MyChart account, a copy of this consent can be sent to you electronically.  As this is a virtual visit, video technology does not allow for your provider to perform a traditional examination. This may limit your provider's ability to fully assess your condition. If your provider identifies any concerns that need to be evaluated in person or the need to arrange testing (such as labs, EKG, etc.), we will make arrangements to do so. Although advances in technology are sophisticated, we cannot ensure that it will always work on either your end or our end. If the connection with a video visit is poor, the visit may have to be switched to a telephone visit. With either a video or telephone visit, we are not always able to ensure that we have a secure connection.  By engaging in this virtual visit, you consent to the provision of healthcare and authorize for your insurance to be billed (if applicable) for the services provided during this visit. Depending on your insurance coverage, you may receive a charge related to this service.  I need to obtain your verbal consent now. Are you willing to proceed with your visit today? Candice Hanson has provided verbal consent on 10/03/2023 for a virtual visit (video or telephone). Candice Rodney, FNP  Date: 10/03/2023 12:58 PM  Virtual Visit via Video Note   I, Candice Hanson, connected with  Candice Hanson  ([id number], [birth year]) on 10/03/23 at  1:00 PM EST by a video-enabled telemedicine application and verified that I am speaking with the correct person using two identifiers.  Location: Patient: Virtual Visit Location Patient:  Home Provider: Virtual Visit Location Provider: Home Office   I discussed the limitations of evaluation and management by telemedicine and the availability of in person appointments. The patient expressed understanding and agreed to proceed.    History of Present Illness: Candice Hanson is a 47 y.o. who identifies as a female who was assigned female at birth, and is being seen today for sinus congestion and pain. States she had URI symptoms last week. Had a negative home COVID test.   HPI: Sinusitis This is a new problem. The current episode started 1 to 4 weeks ago. The problem has been gradually worsening since onset. There has been no fever. Her pain is at a severity of 3/10. The pain is mild. Associated symptoms include congestion, coughing (post nasal drip), ear pain, headaches, a hoarse voice, sinus pressure and sneezing. Pertinent negatives include no sore throat. Past treatments include acetaminophen, oral decongestants and nasal decongestants. The treatment provided mild relief.    Problems:  Patient Active Problem List   Diagnosis Date Noted   Vaginal mass 02/03/2022   Elevated LDL cholesterol level 12/07/2017   Elevated cholesterol with high triglycerides 12/07/2017   Colon cancer screening 06/10/2016   Intrauterine contraceptive device status 06/06/2015   History of migraine 06/06/2015   Asthma, chronic, mild persistent, uncomplicated 06/06/2015    Allergies: No Known Allergies Medications:  Current Outpatient Medications:    amoxicillin-clavulanate (AUGMENTIN) 875-125 MG tablet, Take 1 tablet by mouth 2 (two) times daily., Disp: 14 tablet, Rfl: 0   albuterol (VENTOLIN HFA)  108 (90 Base) MCG/ACT inhaler, Inhale 1-2 puffs into the lungs every 4 (four) hours as needed for wheezing or shortness of breath., Disp: 8.5 each, Rfl: 11   cetirizine (ZYRTEC) 10 MG chewable tablet, Chew 10 mg by mouth daily., Disp: , Rfl:    estradiol (CLIMARA) 0.0375 mg/24hr patch, Place 1 patch (0.0375  mg total) onto the skin once a week., Disp: 4 patch, Rfl: 12   fexofenadine (ALLEGRA ODT) 30 MG disintegrating tablet, Take 30 mg by mouth daily., Disp: , Rfl:    levonorgestrel (MIRENA) 20 MCG/24HR IUD, 1 each by Intrauterine route once., Disp: , Rfl:    meloxicam (MOBIC) 15 MG tablet, TAKE 0.5-1 TABLETS (7.5-15 MG TOTAL) BY MOUTH DAILY. AS NEEDED FOR ACHES/PAINS, Disp: 90 tablet, Rfl: 3   montelukast (SINGULAIR) 10 MG tablet, TAKE 1 TABLET BY MOUTH EVERYDAY AT BEDTIME., Disp: 90 tablet, Rfl: 3   Multiple Vitamins-Minerals (MULTIVITAMIN ADULT PO), Take by mouth., Disp: , Rfl:    QVAR REDIHALER 40 MCG/ACT inhaler, INHALE 1 PUFF INTO THE LUNGS TWICE A DAY, Disp: 10.6 g, Rfl: 11   SUMAtriptan (IMITREX) 50 MG tablet, Take 1 tablet (50 mg total) by mouth every 2 (two) hours as needed for migraine. May repeat in 2 hours if headache persists or recurs., Disp: 10 tablet, Rfl: 11   topiramate (TOPAMAX) 50 MG tablet, Take 1.5 tablets (75 mg total) by mouth 2 (two) times daily., Disp: 270 tablet, Rfl: 3   Turmeric 500 MG CAPS, , Disp: , Rfl:   Observations/Objective: Patient is well-developed, well-nourished in no acute distress.  Resting comfortably  at home.  Head is normocephalic, atraumatic.  No labored breathing.  Speech is clear and coherent with logical content.  Patient is alert and oriented at baseline.  Nasal congestion   Assessment and Plan: 1. Acute sinusitis, recurrence not specified, unspecified location (Primary) - amoxicillin-clavulanate (AUGMENTIN) 875-125 MG tablet; Take 1 tablet by mouth 2 (two) times daily.  Dispense: 14 tablet; Refill: 0  - Take meds as prescribed - Use a cool mist humidifier  -Use saline nose sprays frequently -Force fluids -For any cough or congestion  Use plain Mucinex- regular strength or max strength is fine -For fever or aces or pains- take tylenol or ibuprofen. -Throat lozenges if help -Follow up if symptoms worsen or do not improve   Follow Up  Instructions: I discussed the assessment and treatment plan with the patient. The patient was provided an opportunity to ask questions and all were answered. The patient agreed with the plan and demonstrated an understanding of the instructions.  A copy of instructions were sent to the patient via MyChart unless otherwise noted below.     The patient was advised to call back or seek an in-person evaluation if the symptoms worsen or if the condition fails to improve as anticipated.    Candice Rodney, FNP

## 2024-02-13 NOTE — Progress Notes (Unsigned)
 Complete physical exam  Patient: Candice Hanson   DOB: September 19, 1976   48 y.o. Female  MRN: [id number]  Subjective:    No chief complaint on file.   Marim Klopp is a 48 y.o. female who presents today for a complete physical exam. She reports consuming a {diet types:17450} diet. {types:19826} She generally feels {DESC; WELL/FAIRLY WELL/POORLY:18703}. She reports sleeping {DESC; WELL/FAIRLY WELL/POORLY:18703}. She {does/does not:200015} have additional problems to discuss today.    Most recent fall risk assessment:    02/11/2023    3:48 PM  Fall Risk   Falls in the past year? 0  Number falls in past yr: 0  Injury with Fall? 0  Risk for fall due to : No Fall Risks  Follow up Falls evaluation completed     Most recent depression screenings:    02/11/2023    3:48 PM 02/05/2022   11:03 AM  PHQ 2/9 Scores  PHQ - 2 Score 0 0    {VISON DENTAL STD PSA (Optional):27386}  {History (Optional):23778}  Patient Care Team: Cherre Cornish, NP as PCP - General (Nurse Practitioner)   Outpatient Medications Prior to Visit  Medication Sig   albuterol  (VENTOLIN  HFA) 108 (90 Base) MCG/ACT inhaler Inhale 1-2 puffs into the lungs every 4 (four) hours as needed for wheezing or shortness of breath.   cetirizine (ZYRTEC) 10 MG chewable tablet Chew 10 mg by mouth daily.   estradiol  (CLIMARA ) 0.0375 mg/24hr patch Place 1 patch (0.0375 mg total) onto the skin once a week.   levonorgestrel  (MIRENA ) 20 MCG/24HR IUD 1 each by Intrauterine route once.   meloxicam  (MOBIC ) 15 MG tablet TAKE 0.5-1 TABLETS (7.5-15 MG TOTAL) BY MOUTH DAILY. AS NEEDED FOR ACHES/PAINS   montelukast  (SINGULAIR ) 10 MG tablet TAKE 1 TABLET BY MOUTH EVERYDAY AT BEDTIME.   Multiple Vitamins-Minerals (MULTIVITAMIN ADULT PO) Take by mouth.   QVAR  REDIHALER 40 MCG/ACT inhaler INHALE 1 PUFF INTO THE LUNGS TWICE A DAY   SUMAtriptan  (IMITREX ) 50 MG tablet Take 1 tablet (50 mg total) by mouth every 2 (two) hours as needed for migraine. May  repeat in 2 hours if headache persists or recurs.   topiramate  (TOPAMAX ) 50 MG tablet Take 1.5 tablets (75 mg total) by mouth 2 (two) times daily.   Turmeric 500 MG CAPS    [DISCONTINUED] amoxicillin -clavulanate (AUGMENTIN ) 875-125 MG tablet Take 1 tablet by mouth 2 (two) times daily.   [DISCONTINUED] fexofenadine (ALLEGRA ODT) 30 MG disintegrating tablet Take 30 mg by mouth daily.   No facility-administered medications prior to visit.    ROS        Objective:     There were no vitals taken for this visit. {Vitals History (Optional):23777}  Physical Exam   No results found for any visits on 02/14/24. {Show previous labs (optional):23779}    Assessment & Plan:    Routine Health Maintenance and Physical Exam  Immunization History  Administered Date(s) Administered   Influenza, Seasonal, Injecte, Preservative Fre 10/02/2013   Influenza,inj,Quad PF,6+ Mos 06/10/2016, 08/11/2017, 09/13/2019, 09/16/2020   Influenza-Unspecified 10/01/2011, 07/21/2012, 08/18/2012, 10/02/2013   PFIZER(Purple Top)SARS-COV-2 Vaccination 01/26/2020, 02/19/2020   Pneumococcal Polysaccharide-23 08/18/2012   Tdap 06/10/2016    Health Maintenance  Topic Date Due   Pneumococcal Vaccine 58-97 Years old (2 of 2 - PCV) 08/18/2013   INFLUENZA VACCINE  05/19/2024   Fecal DNA (Cologuard)  12/30/2024   DTaP/Tdap/Td (2 - Td or Tdap) 06/10/2026   Cervical Cancer Screening (HPV/Pap Cotest)  12/04/2026   HIV Screening  Completed  HPV VACCINES  Aged Out   Meningococcal B Vaccine  Aged Out   COVID-19 Vaccine  Discontinued   Hepatitis C Screening  Discontinued    Discussed health benefits of physical activity, and encouraged her to engage in regular exercise appropriate for her age and condition.  Problem List Items Addressed This Visit       Respiratory   Asthma, chronic, mild persistent, uncomplicated (Chronic)     Other   Elevated cholesterol with high triglycerides   History of migraine    Other Visit Diagnoses       Annual physical exam    -  Primary     Need for pneumococcal vaccine          No follow-ups on file.     Kooper Godshall, NP

## 2024-02-14 ENCOUNTER — Encounter: Payer: Self-pay | Admitting: Medical-Surgical

## 2024-02-14 ENCOUNTER — Ambulatory Visit (INDEPENDENT_AMBULATORY_CARE_PROVIDER_SITE_OTHER): Payer: BC Managed Care – PPO | Admitting: Medical-Surgical

## 2024-02-14 VITALS — BP 95/60 | HR 75 | Resp 20 | Ht 65.0 in | Wt 155.4 lb

## 2024-02-14 DIAGNOSIS — J4530 Mild persistent asthma, uncomplicated: Secondary | ICD-10-CM

## 2024-02-14 DIAGNOSIS — Z0000 Encounter for general adult medical examination without abnormal findings: Secondary | ICD-10-CM

## 2024-02-14 DIAGNOSIS — E782 Mixed hyperlipidemia: Secondary | ICD-10-CM | POA: Diagnosis not present

## 2024-02-14 DIAGNOSIS — Z8669 Personal history of other diseases of the nervous system and sense organs: Secondary | ICD-10-CM

## 2024-02-14 DIAGNOSIS — Z23 Encounter for immunization: Secondary | ICD-10-CM | POA: Diagnosis not present

## 2024-02-14 MED ORDER — ALBUTEROL SULFATE HFA 108 (90 BASE) MCG/ACT IN AERS: 1.0000 | INHALATION_SPRAY | RESPIRATORY_TRACT | 11 refills | Status: AC | PRN

## 2024-02-14 MED ORDER — MELOXICAM 15 MG PO TABS: ORAL_TABLET | ORAL | 3 refills | Status: AC

## 2024-02-14 MED ORDER — TOPIRAMATE 50 MG PO TABS: 75.0000 mg | ORAL_TABLET | Freq: Two times a day (BID) | ORAL | 3 refills | Status: AC

## 2024-02-14 MED ORDER — SUMATRIPTAN SUCCINATE 50 MG PO TABS: 50.0000 mg | ORAL_TABLET | ORAL | 11 refills | Status: AC | PRN

## 2024-02-14 MED ORDER — MONTELUKAST SODIUM 10 MG PO TABS
ORAL_TABLET | ORAL | 3 refills | Status: AC
Start: 2024-02-14 — End: ?

## 2024-02-14 MED ORDER — MOMETASONE FURO-FORMOTEROL FUM 100-5 MCG/ACT IN AERO: 2.0000 | INHALATION_SPRAY | Freq: Two times a day (BID) | RESPIRATORY_TRACT | 11 refills | Status: DC

## 2024-02-14 NOTE — Patient Instructions (Signed)
 Preventive Care 16-47 Years Old, Female  Preventive care refers to lifestyle choices and visits with your health care provider that can promote health and wellness. Preventive care visits are also called wellness exams.  What can I expect for my preventive care visit?  Counseling  Your health care provider may ask you questions about your:  Medical history, including:  Past medical problems.  Family medical history.  Pregnancy history.  Current health, including:  Menstrual cycle.  Method of birth control.  Emotional well-being.  Home life and relationship well-being.  Sexual activity and sexual health.  Lifestyle, including:  Alcohol, nicotine or tobacco, and drug use.  Access to firearms.  Diet, exercise, and sleep habits.  Work and work Astronomer.  Sunscreen use.  Safety issues such as seatbelt and bike helmet use.  Physical exam  Your health care provider will check your:  Height and weight. These may be used to calculate your BMI (body mass index). BMI is a measurement that tells if you are at a healthy weight.  Waist circumference. This measures the distance around your waistline. This measurement also tells if you are at a healthy weight and may help predict your risk of certain diseases, such as type 2 diabetes and high blood pressure.  Heart rate and blood pressure.  Body temperature.  Skin for abnormal spots.  What immunizations do I need?    Vaccines are usually given at various ages, according to a schedule. Your health care provider will recommend vaccines for you based on your age, medical history, and lifestyle or other factors, such as travel or where you work.  What tests do I need?  Screening  Your health care provider may recommend screening tests for certain conditions. This may include:  Lipid and cholesterol levels.  Diabetes screening. This is done by checking your blood sugar (glucose) after you have not eaten for a while (fasting).  Pelvic exam and Pap test.  Hepatitis B test.  Hepatitis C  test.  HIV (human immunodeficiency virus) test.  STI (sexually transmitted infection) testing, if you are at risk.  Lung cancer screening.  Colorectal cancer screening.  Mammogram. Talk with your health care provider about when you should start having regular mammograms. This may depend on whether you have a family history of breast cancer.  BRCA-related cancer screening. This may be done if you have a family history of breast, ovarian, tubal, or peritoneal cancers.  Bone density scan. This is done to screen for osteoporosis.  Talk with your health care provider about your test results, treatment options, and if necessary, the need for more tests.  Follow these instructions at home:  Eating and drinking    Eat a diet that includes fresh fruits and vegetables, whole grains, lean protein, and low-fat dairy products.  Take vitamin and mineral supplements as recommended by your health care provider.  Do not drink alcohol if:  Your health care provider tells you not to drink.  You are pregnant, may be pregnant, or are planning to become pregnant.  If you drink alcohol:  Limit how much you have to 0-1 drink a day.  Know how much alcohol is in your drink. In the [place] one drink equals one 12 oz bottle of beer (355 mL), one 5 oz glass of wine (148 mL), or one 1 oz glass of hard liquor (44 mL).  Lifestyle  Brush your teeth every morning and night with fluoride toothpaste. Floss one time each day.  Exercise for at least  30 minutes 5 or more days each week.  Do not use any products that contain nicotine or tobacco. These products include cigarettes, chewing tobacco, and vaping devices, such as e-cigarettes. If you need help quitting, ask your health care provider.  Do not use drugs.  If you are sexually active, practice safe sex. Use a condom or other form of protection to prevent STIs.  If you do not wish to become pregnant, use a form of birth control. If you plan to become pregnant, see your health care provider for a  prepregnancy visit.  Take aspirin only as told by your health care provider. Make sure that you understand how much to take and what form to take. Work with your health care provider to find out whether it is safe and beneficial for you to take aspirin daily.  Find healthy ways to manage stress, such as:  Meditation, yoga, or listening to music.  Journaling.  Talking to a trusted person.  Spending time with friends and family.  Minimize exposure to UV radiation to reduce your risk of skin cancer.  Safety  Always wear your seat belt while driving or riding in a vehicle.  Do not drive:  If you have been drinking alcohol. Do not ride with someone who has been drinking.  When you are tired or distracted.  While texting.  If you have been using any mind-altering substances or drugs.  Wear a helmet and other protective equipment during sports activities.  If you have firearms in your house, make sure you follow all gun safety procedures.  Seek help if you have been physically or sexually abused.  What's next?  Visit your health care provider once a year for an annual wellness visit.  Ask your health care provider how often you should have your eyes and teeth checked.  Stay up to date on all vaccines.  This information is not intended to replace advice given to you by your health care provider. Make sure you discuss any questions you have with your health care provider.  Document Revised: 04/02/2021 Document Reviewed: 04/02/2021  Elsevier Patient Education  2024 [employer].

## 2024-02-15 ENCOUNTER — Encounter: Payer: Self-pay | Admitting: Medical-Surgical

## 2024-02-15 LAB — CMP14+EGFR
ALT: 14 IU/L (ref 0–32)
AST: 19 IU/L (ref 0–40)
Albumin: 4.6 g/dL (ref 3.9–4.9)
Alkaline Phosphatase: 64 IU/L (ref 44–121)
BUN/Creatinine Ratio: 14 (ref 9–23)
BUN: 13 mg/dL (ref 6–24)
Bilirubin Total: 0.3 mg/dL (ref 0.0–1.2)
CO2: 20 mmol/L (ref 20–29)
Calcium: 9.3 mg/dL (ref 8.7–10.2)
Chloride: 109 mmol/L — ABNORMAL HIGH (ref 96–106)
Creatinine, Ser: 0.94 mg/dL (ref 0.57–1.00)
Globulin, Total: 2.2 g/dL (ref 1.5–4.5)
Glucose: 102 mg/dL — ABNORMAL HIGH (ref 70–99)
Potassium: 4.2 mmol/L (ref 3.5–5.2)
Sodium: 144 mmol/L (ref 134–144)
Total Protein: 6.8 g/dL (ref 6.0–8.5)
eGFR: 75 mL/min/{1.73_m2} (ref >59)

## 2024-02-15 LAB — CBC WITH DIFFERENTIAL/PLATELET
Basophils Absolute: 0.1 10*3/uL (ref 0.0–0.2)
Basos: 1 %
EOS (ABSOLUTE): 0.6 10*3/uL — ABNORMAL HIGH (ref 0.0–0.4)
Eos: 11 %
Hematocrit: 43.8 % (ref 34.0–46.6)
Hemoglobin: 14 g/dL (ref 11.1–15.9)
Immature Grans (Abs): 0 10*3/uL (ref 0.0–0.1)
Immature Granulocytes: 0 %
Lymphocytes Absolute: 2 10*3/uL (ref 0.7–3.1)
Lymphs: 36 %
MCH: 29 pg (ref 26.6–33.0)
MCHC: 32 g/dL (ref 31.5–35.7)
MCV: 91 fL (ref 79–97)
Monocytes Absolute: 0.4 10*3/uL (ref 0.1–0.9)
Monocytes: 7 %
Neutrophils Absolute: 2.5 10*3/uL (ref 1.4–7.0)
Neutrophils: 45 %
Platelets: 285 10*3/uL (ref 150–450)
RBC: 4.83 x10E6/uL (ref 3.77–5.28)
RDW: 12.1 % (ref 11.7–15.4)
WBC: 5.6 10*3/uL (ref 3.4–10.8)

## 2024-02-15 LAB — LIPID PANEL
Chol/HDL Ratio: 3.6 ratio (ref 0.0–4.4)
Cholesterol, Total: 178 mg/dL (ref 100–199)
HDL: 50 mg/dL (ref >39)
LDL Chol Calc (NIH): 115 mg/dL — ABNORMAL HIGH (ref 0–99)
Triglycerides: 70 mg/dL (ref 0–149)
VLDL Cholesterol Cal: 13 mg/dL (ref 5–40)

## 2024-06-30 ENCOUNTER — Telehealth: Admitting: Physician Assistant

## 2024-06-30 DIAGNOSIS — J019 Acute sinusitis, unspecified: Secondary | ICD-10-CM | POA: Diagnosis not present

## 2024-06-30 DIAGNOSIS — B9689 Other specified bacterial agents as the cause of diseases classified elsewhere: Secondary | ICD-10-CM

## 2024-06-30 MED ORDER — AMOXICILLIN-POT CLAVULANATE 875-125 MG PO TABS: 1.0000 | ORAL_TABLET | Freq: Two times a day (BID) | ORAL | 0 refills | Status: DC

## 2024-06-30 NOTE — Progress Notes (Signed)

## 2024-07-06 ENCOUNTER — Other Ambulatory Visit: Payer: Self-pay | Admitting: Medical-Surgical

## 2024-07-06 NOTE — Telephone Encounter (Signed)
This drug is on back order

## 2024-07-07 ENCOUNTER — Telehealth: Payer: Self-pay

## 2024-07-07 NOTE — Telephone Encounter (Signed)
 Copied from CRM [id number]. Topic: Clinical - Prescription Issue >> Jul 07, 2024 12:53 PM Kevelyn M wrote: Reason for CRM: Dulera inhaler-the manufacturer can not keep up with demand and will not be able to fill order. Please contact pharmacy to find out which alternative she can use.  Call [facility]# [phone]

## 2024-07-10 NOTE — Addendum Note (Signed)
 Addended byBETHA WILLO MINI on: 07/10/2024 08:22 PM   Modules accepted: Orders

## 2024-07-10 NOTE — Telephone Encounter (Signed)
 Spoke with pharmacist - states he would recommend the generic symbicort  or Breo for once daily use and generic advair but this is twice daily use.

## 2024-07-11 ENCOUNTER — Other Ambulatory Visit: Payer: Self-pay | Admitting: Medical-Surgical

## 2024-07-11 MED ORDER — BUDESONIDE-FORMOTEROL FUMARATE 80-4.5 MCG/ACT IN AERO: 2.0000 | INHALATION_SPRAY | Freq: Two times a day (BID) | RESPIRATORY_TRACT | 3 refills | Status: DC

## 2024-07-11 NOTE — Telephone Encounter (Signed)
 I contacted the pharmacy and the alternative would be Symbicort .

## 2024-08-13 ENCOUNTER — Other Ambulatory Visit: Payer: Self-pay | Admitting: Obstetrics & Gynecology

## 2024-08-21 ENCOUNTER — Telehealth: Payer: Self-pay | Admitting: *Deleted

## 2024-08-21 NOTE — Telephone Encounter (Signed)
 Left patient a message to call and schedule annual with Dr. Cris to be able to refill medication per Dr. Cris.

## 2024-10-08 ENCOUNTER — Other Ambulatory Visit: Payer: Self-pay | Admitting: Medical-Surgical

## 2024-11-16 ENCOUNTER — Encounter: Payer: Self-pay | Admitting: Obstetrics and Gynecology

## 2024-11-16 ENCOUNTER — Other Ambulatory Visit: Payer: Self-pay | Admitting: Obstetrics and Gynecology

## 2024-11-16 ENCOUNTER — Ambulatory Visit: Admitting: Obstetrics and Gynecology

## 2024-11-16 VITALS — BP 118/73 | HR 76 | Ht 65.0 in | Wt 140.0 lb

## 2024-11-16 DIAGNOSIS — Z1211 Encounter for screening for malignant neoplasm of colon: Secondary | ICD-10-CM

## 2024-11-16 DIAGNOSIS — N951 Menopausal and female climacteric states: Secondary | ICD-10-CM | POA: Diagnosis not present

## 2024-11-16 DIAGNOSIS — Z1231 Encounter for screening mammogram for malignant neoplasm of breast: Secondary | ICD-10-CM

## 2024-11-16 DIAGNOSIS — Z01419 Encounter for gynecological examination (general) (routine) without abnormal findings: Secondary | ICD-10-CM

## 2024-11-16 MED ORDER — ESTRADIOL 0.05 MG/24HR TD PTWK: 0.0500 mg | MEDICATED_PATCH | TRANSDERMAL | 12 refills | Status: AC

## 2024-11-16 NOTE — Progress Notes (Addendum)
 "  ANNUAL EXAM Patient name: Candice Hanson MRN [id number]  Date of birth: 03/25/1976 Chief Complaint:   Gynecologic Exam  History of Present Illness:   Mirtie Bastyr is a 49 y.o. [phone] with No LMP recorded. (Menstrual status: IUD). being seen today for a routine annual exam.  Current complaints:  IUD in place since 2024. Doing well with estradiol  patch but still having bothersome hot flashes and night sweats.  Lump that was biopsied by Cris is gone and has been gone  Last pap 12/04/21. Results were: NILM w/ HRHPV negative. H/O abnormal pap: no Last mammogram: 12/18/21. Results were: BIRADS 1 Last colonoscopy: 12/30/21. Cologuard negative  Does not know Fhx (adopted)     11/16/2024    1:36 PM 02/14/2024    9:06 AM 02/11/2023    3:48 PM 02/05/2022   11:03 AM 09/16/2020    8:43 AM  Depression screen PHQ 2/9  Decreased Interest 0 0 0 0 0  Down, Depressed, Hopeless 0 0 0 0 0  PHQ - 2 Score 0 0 0 0 0  Altered sleeping 1      Tired, decreased energy 0      Change in appetite 1      Feeling bad or failure about yourself  0      Trouble concentrating 0      Moving slowly or fidgety/restless 0      Suicidal thoughts 0      PHQ-9 Score 2            11/16/2024    1:36 PM 09/13/2019    8:51 AM  GAD 7 : Generalized Anxiety Score  Nervous, Anxious, on Edge 1 0   Control/stop worrying 0 0   Worry too much - different things 0 0   Trouble relaxing 0 0   Restless 0 0   Easily annoyed or irritable 1 0   Afraid - awful might happen 0 0   Total GAD 7 Score 2 0  Anxiety Difficulty  Not difficult at all     Data saved with a previous flowsheet row definition     Review of Systems:   Pertinent items are noted in HPI Denies any headaches, blurred vision, fatigue, shortness of breath, chest pain, abdominal pain, abnormal vaginal discharge/itching/odor/irritation, problems with periods, bowel movements, urination, or intercourse unless otherwise stated above. Pertinent History Reviewed:   Reviewed past medical,surgical, social and family history.  Reviewed problem list, medications and allergies. Physical Assessment:   Vitals:   11/16/24 1313  BP: 118/73  Pulse: 76  Weight: 140 lb (63.5 kg)  Height: 5' 5 (1.651 m)  Body mass index is 23.3 kg/m.        Physical Examination:   General appearance - well appearing, and in no distress  Mental status - alert, oriented to person, place, and time  Chest - respiratory effort normal  Heart - normal peripheral perfusion  Breasts - breasts appear normal, no suspicious masses, no skin or nipple changes or axillary nodes  Abdomen - soft, nontender, nondistended, no masses or organomegaly  Pelvic - VULVA: normal appearing vulva with no masses, tenderness or lesions  VAGINA: normal appearing vagina with normal color and discharge, no lesions  CERVIX: no mass, no CMT  UTERUS: uterus is felt to be normal size, shape, consistency and nontender   ADNEXA: No adnexal masses or tenderness noted.  Chaperone present for exam  No results found for this or any previous visit (from the past 24 hours).  Assessment & Plan:  1) Well-Woman Exam Mammogram: schedule screening mammo as soon as possible Colonoscopy: schedule screening colonoscopy as soon as possible Pap: Due 2028 GC/CT/HIV/HCV: UTD  2) Vasomotor symptoms Will increase dose of climara  patch to 0.05mg  weekly Refills x 1 year, will notify if symptoms not at goal or concerns about side effects  Labs/procedures today:  Orders Placed This Encounter  Procedures   MM 3D SCREENING MAMMOGRAM BILATERAL BREAST   Ambulatory referral to Gastroenterology    Meds:  Meds ordered this encounter  Medications   estradiol  (CLIMARA ) 0.05 mg/24hr patch    Sig: Place 1 patch (0.05 mg total) onto the skin once a week.    Dispense:  4 patch    Refill:  12    Follow-up: Return in about 1 year (around 11/16/2025) for annual exam or sooner as needed.  Kieth JAYSON Carolin,  MD 11/16/2024 1:37 PM  "

## 2025-02-14 ENCOUNTER — Encounter: Admitting: Medical-Surgical
